# Patient Record
Sex: Male | Born: 1960 | Race: White | Hispanic: No | Marital: Single | Smoking: Never smoker
Health system: Southern US, Community
[De-identification: ages and names within clinical notes are randomized; demographics above are authoritative.]

## PROBLEM LIST (undated history)

## (undated) DIAGNOSIS — I471 Supraventricular tachycardia, unspecified: Secondary | ICD-10-CM

## (undated) DIAGNOSIS — R000 Tachycardia, unspecified: Secondary | ICD-10-CM

## (undated) HISTORY — PX: VASECTOMY: SHX75

## (undated) HISTORY — DX: Tachycardia, unspecified: R00.0

---

## 2006-12-15 ENCOUNTER — Ambulatory Visit: Payer: Self-pay | Admitting: Cardiology

## 2007-01-01 ENCOUNTER — Ambulatory Visit: Payer: Self-pay | Admitting: Cardiology

## 2007-01-01 ENCOUNTER — Encounter: Payer: Self-pay | Admitting: Cardiology

## 2007-01-01 ENCOUNTER — Ambulatory Visit: Payer: Self-pay

## 2007-01-27 ENCOUNTER — Ambulatory Visit: Payer: Self-pay | Admitting: Cardiology

## 2008-11-11 ENCOUNTER — Emergency Department (HOSPITAL_COMMUNITY): Admission: EM | Admit: 2008-11-11 | Discharge: 2008-11-12 | Payer: Self-pay | Admitting: Emergency Medicine

## 2010-12-20 LAB — COMPREHENSIVE METABOLIC PANEL
AST: 26 U/L (ref 0–37)
CO2: 24 mEq/L (ref 19–32)
Calcium: 9.4 mg/dL (ref 8.4–10.5)
Creatinine, Ser: 1.17 mg/dL (ref 0.4–1.5)
GFR calc Af Amer: >60 mL/min (ref >60)
GFR calc non Af Amer: >60 mL/min (ref >60)
Total Protein: 7.2 g/dL (ref 6.0–8.3)

## 2010-12-20 LAB — URINALYSIS, ROUTINE W REFLEX MICROSCOPIC
Hgb urine dipstick: NEGATIVE
Ketones, ur: 15 mg/dL — AB
Protein, ur: NEGATIVE mg/dL
Specific Gravity, Urine: 1.027 (ref 1.005–1.030)
Urobilinogen, UA: 0.2 mg/dL (ref 0.0–1.0)

## 2010-12-20 LAB — LIPASE, BLOOD: Lipase: 22 U/L (ref 11–59)

## 2010-12-20 LAB — DIFFERENTIAL
Eosinophils Relative: 0 % (ref 0–5)
Lymphocytes Relative: 2 % — ABNORMAL LOW (ref 12–46)
Lymphs Abs: 0.2 10*3/uL — ABNORMAL LOW (ref 0.7–4.0)
Neutrophils Relative %: 94 % — ABNORMAL HIGH (ref 43–77)

## 2010-12-20 LAB — CBC
MCHC: 34.1 g/dL (ref 30.0–36.0)
MCV: 90.4 fL (ref 78.0–100.0)
RBC: 5.03 MIL/uL (ref 4.22–5.81)
RDW: 12.5 % (ref 11.5–15.5)

## 2011-01-22 NOTE — Assessment & Plan Note (Signed)
[facility]                            CARDIOLOGY OFFICE NOTE   ANUP, BRIGHAM                           MRN:          [id number]  DATE:01/27/2007                            DOB:          Dec 08, 1960    Mr. Louis Leon is a pleasant 50 year old gentleman that I recently saw for  atypical chest pain and palpitations.  We did schedule him to have an  echocardiogram, which was performed on January 01, 2007.  His LV function  was normal.  There were no significant abnormalities noted.  He also had  a stress echocardiogram performed on January 01, 2007.  He exercised for  13 minutes.  There were no ST changes, and there was no significant wall  motion abnormality.  Note, we also checked a D-dimer, which was normal  at 0.22.  The patient also complained of palpitations and we scheduled  him to have an event monitor, but he did not wear this.  Since that  time, he has not had chest pain or palpitations, and there is no  dyspnea.  He is on no medications.   PHYSICAL EXAMINATION:  VITAL SIGNS:  His blood pressure is 102/79, and  his pulse is 70.  He weighs 156 pounds.  NECK:  Supple.  CHEST:  Clear.  CARDIOVASCULAR:  Regular rhythm.  EXTREMITIES:  No edema.   DIAGNOSES:  1. Recent atypical chest pain - his stress echocardiogram was normal.      We will not pursue further cardiac workup.  Note, his D-dimer was      also normal.  He has had no further chest pain.  2. History of palpitations - we will continue to follow him for any      recurrent arrhythmias, and he will wear the event monitor as      scheduled.  If we find significant arrhythmias, then we will treat      as indicated.  I have asked him to see [place] on a p.r.n. basis.     Louis Frieze Jens Som, MD, [facility]  Electronically Signed    BSC/MedQ  DD: 01/27/2007  DT: 01/27/2007  Job #: [id number]   cc:   [facility]

## 2011-01-25 NOTE — Assessment & Plan Note (Signed)
[place] [facility]                            CARDIOLOGY OFFICE NOTE   YGNACIO, FECTEAU                           MRN:          [id number]  DATE:12/15/2006                            DOB:          [birth year]    The patient is a very pleasant 50 year old gentleman with no prior  cardiac history who presents for evaluation of chest pain and  palpitations.  The patient states he has had an intermittent sharp pain  in his chest for years.  However, over the past 2 months he has had  episodes of sharp pain in the left chest area that have lasted for  hours, up to a day.  The pain increases with inspiration but is not  positional nor is it exertional.  It is not related to food.  There is  no associated nausea, vomiting, shortness of breath or diaphoresis.  His  last episode was approximately 2 weeks ago.  Because of his symptoms, he  wanted to be evaluated.  Also note he has had intermittent palpitations  for years.  He describes his heart as racing with sudden onset.  There  is associated weakness but there is no chest pain, shortness of breath  or syncope.  It typically resolves in 30 seconds to 1 minute.   MEDICATIONS:  None.  He has no known drug allergies.   SOCIAL HISTORY:  He does not smoke and only occasionally consumes  alcohol.  There is no cocaine use.   FAMILY HISTORY:  Negative for coronary artery disease or sudden death.   PAST MEDICAL HISTORY:  There is no diabetes mellitus, hypertension, or  hyperlipidemia.  He has had a prior hernia repair as a child and his  wisdom teeth removed.   REVIEW OF SYSTEMS:  He denies any headaches or fevers or chills.  There  is no productive cough or hemoptysis.  There is no dysphagia,  odynophagia, melena, or hematochezia.  There is no dysuria or hematuria.  There is no rash or seizure activity.  There is no orthopnea, PND or  pedal edema.  There is no claudication noted.  The remaining systems are   negative.   PHYSICAL EXAMINATION TODAY:  VITAL SIGNS:  Shows a blood pressure of  120/64 and his pulse is 66.  He weighs 157 pounds.  GENERAL:  He is well-developed and well-nourished, no acute distress.  His skin is warm and dry.  He does not appear to be depressed and there  is no peripheral clubbing.  His back is normal.  HEENT:  Unremarkable with normal eyelids.  NECK:  Supple with a normal upstroke bilaterally and there are no bruits  noted.  There is no jugular venous distention and no thyromegaly noted.  CHEST:  Clear to auscultation with normal expansion.  CARDIOVASCULAR:  Reveals a regular rate and rhythm, normal S1 and S2.  There are no murmurs, rubs or gallops noted.  His PMI is nondisplaced.  ABDOMEN:  Not tender or distended.  Positive bowel sounds, no  hepatosplenomegaly, no mass appreciated.  There is no abdominal bruit.  He has 2+ femoral pulses bilaterally and no bruits.  EXTREMITIES:  Show no edema and I can palpate no cords.  He has 2+  dorsalis pedis pulses bilaterally.  NEUROLOGIC:  Grossly intact.   Electrocardiogram shows a sinus rhythm at a rate of 66.  There is LVH.  There is diffuse ST elevation consistent with most likely repolarization  abnormality.   DIAGNOSIS:  1. Atypical chest pain.  2. History of palpitations.   PLAN:  Mr. Thieme presents for evaluation of chest pain of uncertain  etiology.  His description is atypical and pleuritic.  We will check a D-  dimer.  I will also schedule him to have an echocardiogram to quantify  his left ventricular function and to exclude effusion, although I think  his ST elevation is related to early repolarization abnormality.  We  will also schedule him to have a stress echocardiogram to exclude  coronary disease, although I think this is unlikely.  He also is  describing what sounds to be potentially SVT.  We will schedule an event  monitor for further evaluation.  I will see him back in 4-6 weeks to  review the  above.     Madolyn Frieze Jens Som, MD, [facility]  Electronically Signed    BSC/MedQ  DD: 12/15/2006  DT: 12/15/2006  Job #: [id number]   cc:   [facility]

## 2011-04-14 ENCOUNTER — Inpatient Hospital Stay (HOSPITAL_COMMUNITY)
Admission: EM | Admit: 2011-04-14 | Discharge: 2011-04-15 | DRG: 139 | Disposition: A | Discharge status: Home / Self Care | Payer: BC Managed Care – PPO | Attending: Cardiovascular Disease | Admitting: Cardiovascular Disease

## 2011-04-14 ENCOUNTER — Emergency Department (HOSPITAL_COMMUNITY): Payer: BC Managed Care – PPO

## 2011-04-14 DIAGNOSIS — I498 Other specified cardiac arrhythmias: Principal | ICD-10-CM | POA: Diagnosis present

## 2011-04-14 DIAGNOSIS — R002 Palpitations: Secondary | ICD-10-CM | POA: Diagnosis present

## 2011-04-14 LAB — TROPONIN I: Troponin I: <0.3 ng/mL (ref <0.30)

## 2011-04-14 LAB — POCT I-STAT, CHEM 8
BUN: 13 mg/dL (ref 6–23)
Chloride: 107 mEq/L (ref 96–112)
Sodium: 141 mEq/L (ref 135–145)

## 2011-04-14 LAB — CBC
Hemoglobin: 15.3 g/dL (ref 13.0–17.0)
MCH: 30.4 pg (ref 26.0–34.0)
MCV: 88.7 fL (ref 78.0–100.0)
RBC: 5.04 MIL/uL (ref 4.22–5.81)

## 2011-04-14 LAB — DIFFERENTIAL
Lymphs Abs: 2.6 10*3/uL (ref 0.7–4.0)
Monocytes Relative: 14 % — ABNORMAL HIGH (ref 3–12)
Neutro Abs: 9 10*3/uL — ABNORMAL HIGH (ref 1.7–7.7)
Neutrophils Relative %: 66 % (ref 43–77)

## 2011-04-15 LAB — LIPID PANEL
Cholesterol: 191 mg/dL (ref 0–200)
HDL: 35 mg/dL — ABNORMAL LOW (ref >39)
Total CHOL/HDL Ratio: 5.5 RATIO

## 2013-08-03 ENCOUNTER — Ambulatory Visit (INDEPENDENT_AMBULATORY_CARE_PROVIDER_SITE_OTHER): Payer: BC Managed Care – PPO | Admitting: Family Medicine

## 2013-08-03 ENCOUNTER — Encounter: Payer: Self-pay | Admitting: Family Medicine

## 2013-08-03 VITALS — BP 97/57 | HR 75 | Temp 97.8°F | Wt 163.8 lb

## 2013-08-03 DIAGNOSIS — J399 Disease of upper respiratory tract, unspecified: Secondary | ICD-10-CM

## 2013-08-03 DIAGNOSIS — J398 Other specified diseases of upper respiratory tract: Secondary | ICD-10-CM

## 2013-08-03 MED ORDER — AMOXICILLIN-POT CLAVULANATE 875-125 MG PO TABS: 1.0000 | ORAL_TABLET | Freq: Two times a day (BID) | ORAL | Status: DC

## 2013-08-03 MED ORDER — BENZONATATE 100 MG PO CAPS: 100.0000 mg | ORAL_CAPSULE | Freq: Two times a day (BID) | ORAL | Status: DC | PRN

## 2013-08-03 NOTE — Progress Notes (Signed)
  Subjective:    Patient ID: Louis Leon, male    DOB: 1960/11/19, 52 y.o.   MRN: [id number]  Sinusitis This is a new problem. The current episode started 1 to 4 weeks ago (About 12 days ago). The problem has been gradually worsening since onset. There has been no fever. His pain is at a severity of 2/10. The pain is mild. Associated symptoms include congestion, coughing, sinus pressure, sneezing, a sore throat and swollen glands. Pertinent negatives include no ear pain, headaches, neck pain or shortness of breath. Past treatments include acetaminophen. The treatment provided mild relief.      Review of Systems  HENT: Positive for congestion, sinus pressure, sneezing and sore throat. Negative for ear pain.   Respiratory: Positive for cough. Negative for shortness of breath.   Musculoskeletal: Negative for neck pain.  Neurological: Negative for headaches.  All other systems reviewed and are negative.       Objective:   Physical Exam  Vitals reviewed. Constitutional: He is oriented to person, place, and time. He appears well-developed and well-nourished.  HENT:  Head: Normocephalic.  Right Ear: External ear normal.  Left Ear: External ear normal.  Nose: Nose normal. Right sinus exhibits no maxillary sinus tenderness and no frontal sinus tenderness. Left sinus exhibits no maxillary sinus tenderness and no frontal sinus tenderness.  Mouth/Throat: Oropharynx is clear and moist.  Eyes: Pupils are equal, round, and reactive to light.  Neck: Normal range of motion. Neck supple. No thyromegaly present.  Cardiovascular: Normal rate, regular rhythm, normal heart sounds and intact distal pulses.   No murmur heard. Pulmonary/Chest: Effort normal and breath sounds normal. No respiratory distress. He has no wheezes.  Abdominal: Soft. Bowel sounds are normal. He exhibits no distension. There is no tenderness.  Neurological: He is alert and oriented to person, place, and time.  Skin: Skin is warm and  dry. No rash noted. No erythema.  Psychiatric: He has a normal mood and affect. His behavior is normal. Judgment and thought content normal.     BP 97/57  Pulse 75  Temp(Src) 97.8 F (36.6 C) (Oral)  Wt 163 lb 12.8 oz (74.299 kg)      Assessment & Plan:  Upper respiratory disease - Plan: amoxicillin-clavulanate (AUGMENTIN) 875-125 MG per tablet, benzonatate (TESSALON) 100 MG capsule  Deatra Canter FNP

## 2013-08-03 NOTE — Patient Instructions (Addendum)

## 2014-02-07 ENCOUNTER — Other Ambulatory Visit: Payer: Self-pay

## 2014-02-07 MED ORDER — DILTIAZEM HCL 30 MG PO TABS: 30.0000 mg | ORAL_TABLET | Freq: Two times a day (BID) | ORAL | Status: DC

## 2014-02-07 MED ORDER — METOPROLOL TARTRATE 25 MG PO TABS: 25.0000 mg | ORAL_TABLET | Freq: Two times a day (BID) | ORAL | Status: DC

## 2014-02-07 NOTE — Telephone Encounter (Signed)
Last seen 08/03/13  B Oxford 

## 2014-02-07 NOTE — Telephone Encounter (Signed)
Last seen 08/03/13 B [place]

## 2014-09-07 ENCOUNTER — Ambulatory Visit (INDEPENDENT_AMBULATORY_CARE_PROVIDER_SITE_OTHER): Payer: BC Managed Care – PPO | Admitting: Family Medicine

## 2014-09-07 ENCOUNTER — Encounter: Payer: Self-pay | Admitting: Family Medicine

## 2014-09-07 VITALS — BP 102/66 | HR 61 | Temp 97.7°F | Ht 72.0 in | Wt 160.0 lb

## 2014-09-07 DIAGNOSIS — H6123 Impacted cerumen, bilateral: Secondary | ICD-10-CM

## 2014-09-07 NOTE — Progress Notes (Signed)
   Subjective:    Patient ID: Louis Leon, male    DOB: 05/13/1961, 53 y.o.   MRN: [id number]  HPI  C/o cerumen impactions bilateral. Review of Systems  Constitutional: Negative for fever.  HENT: Negative for ear pain.   Eyes: Negative for discharge.  Respiratory: Negative for cough.   Cardiovascular: Negative for chest pain.  Gastrointestinal: Negative for abdominal distention.  Endocrine: Negative for polyuria.  Genitourinary: Negative for difficulty urinating.  Musculoskeletal: Negative for gait problem and neck pain.  Skin: Negative for color change and rash.  Neurological: Negative for speech difficulty and headaches.  Psychiatric/Behavioral: Negative for agitation.       Objective:    BP 102/66 mmHg  Pulse 61  Temp(Src) 97.7 F (36.5 C) (Oral)  Ht 6' (1.829 m)  Wt 160 lb (72.576 kg)  BMI 21.70 kg/m2 Physical Exam  Constitutional: He is oriented to person, place, and time. He appears well-developed and well-nourished.  HENT:  Head: Normocephalic and atraumatic.  Mouth/Throat: Oropharynx is clear and moist.  Bilateral EAC's with cerumen impaction  Eyes: Pupils are equal, round, and reactive to light.  Neck: Normal range of motion. Neck supple.  Cardiovascular: Normal rate and regular rhythm.   No murmur heard. Pulmonary/Chest: Effort normal and breath sounds normal.  Abdominal: Soft. Bowel sounds are normal. There is no tenderness.  Neurological: He is alert and oriented to person, place, and time.  Skin: Skin is warm and dry.  Psychiatric: He has a normal mood and affect.          Assessment & Plan:     ICD-9-CM ICD-10-CM   1. Cerumen impaction, bilateral 380.4 H61.23    Irrigate bilateral ears   Return if symptoms worsen or fail to improve.  Deatra CanterWilliam J Oxford FNP

## 2019-03-01 ENCOUNTER — Encounter: Payer: Self-pay | Admitting: *Deleted

## 2020-10-04 ENCOUNTER — Ambulatory Visit
Admission: RE | Admit: 2020-10-04 | Discharge: 2020-10-04 | Disposition: A | Discharge status: Home / Self Care | Payer: BC Managed Care – PPO | Source: Ambulatory Visit | Attending: Cardiovascular Disease | Admitting: Cardiovascular Disease

## 2020-10-04 ENCOUNTER — Other Ambulatory Visit: Payer: Self-pay | Admitting: Cardiovascular Disease

## 2020-10-04 DIAGNOSIS — R0602 Shortness of breath: Secondary | ICD-10-CM

## 2021-05-17 DIAGNOSIS — E7849 Other hyperlipidemia: Secondary | ICD-10-CM | POA: Diagnosis not present

## 2021-05-17 DIAGNOSIS — R072 Precordial pain: Secondary | ICD-10-CM | POA: Diagnosis not present

## 2021-05-17 DIAGNOSIS — R0602 Shortness of breath: Secondary | ICD-10-CM | POA: Diagnosis not present

## 2021-05-17 DIAGNOSIS — I471 Supraventricular tachycardia: Secondary | ICD-10-CM | POA: Diagnosis not present

## 2021-08-22 DIAGNOSIS — L821 Other seborrheic keratosis: Secondary | ICD-10-CM | POA: Diagnosis not present

## 2021-08-22 DIAGNOSIS — L218 Other seborrheic dermatitis: Secondary | ICD-10-CM | POA: Diagnosis not present

## 2021-10-24 DIAGNOSIS — E876 Hypokalemia: Secondary | ICD-10-CM | POA: Diagnosis not present

## 2021-10-24 DIAGNOSIS — E7849 Other hyperlipidemia: Secondary | ICD-10-CM | POA: Diagnosis not present

## 2021-10-24 DIAGNOSIS — Z79899 Other long term (current) drug therapy: Secondary | ICD-10-CM | POA: Diagnosis not present

## 2022-02-05 DIAGNOSIS — I959 Hypotension, unspecified: Secondary | ICD-10-CM | POA: Diagnosis not present

## 2022-02-05 DIAGNOSIS — R519 Headache, unspecified: Secondary | ICD-10-CM | POA: Diagnosis not present

## 2022-02-05 DIAGNOSIS — R7401 Elevation of levels of liver transaminase levels: Secondary | ICD-10-CM | POA: Diagnosis not present

## 2022-02-05 DIAGNOSIS — B349 Viral infection, unspecified: Secondary | ICD-10-CM | POA: Diagnosis not present

## 2022-02-05 DIAGNOSIS — M542 Cervicalgia: Secondary | ICD-10-CM | POA: Diagnosis not present

## 2022-02-05 DIAGNOSIS — I471 Supraventricular tachycardia: Secondary | ICD-10-CM | POA: Diagnosis not present

## 2022-02-05 DIAGNOSIS — R1011 Right upper quadrant pain: Secondary | ICD-10-CM | POA: Diagnosis not present

## 2022-02-05 DIAGNOSIS — R0689 Other abnormalities of breathing: Secondary | ICD-10-CM | POA: Diagnosis not present

## 2022-02-05 DIAGNOSIS — R10816 Epigastric abdominal tenderness: Secondary | ICD-10-CM | POA: Diagnosis not present

## 2022-02-05 DIAGNOSIS — G44209 Tension-type headache, unspecified, not intractable: Secondary | ICD-10-CM | POA: Diagnosis not present

## 2022-02-05 DIAGNOSIS — Z20822 Contact with and (suspected) exposure to covid-19: Secondary | ICD-10-CM | POA: Diagnosis not present

## 2022-02-05 DIAGNOSIS — R7989 Other specified abnormal findings of blood chemistry: Secondary | ICD-10-CM | POA: Diagnosis not present

## 2022-02-05 DIAGNOSIS — G4489 Other headache syndrome: Secondary | ICD-10-CM | POA: Diagnosis not present

## 2022-02-05 DIAGNOSIS — R0602 Shortness of breath: Secondary | ICD-10-CM | POA: Diagnosis not present

## 2022-02-05 DIAGNOSIS — K824 Cholesterolosis of gallbladder: Secondary | ICD-10-CM | POA: Diagnosis not present

## 2022-02-05 DIAGNOSIS — I4891 Unspecified atrial fibrillation: Secondary | ICD-10-CM | POA: Diagnosis not present

## 2022-02-05 DIAGNOSIS — M436 Torticollis: Secondary | ICD-10-CM | POA: Diagnosis not present

## 2022-02-06 ENCOUNTER — Other Ambulatory Visit: Payer: Self-pay

## 2022-02-06 ENCOUNTER — Encounter (HOSPITAL_BASED_OUTPATIENT_CLINIC_OR_DEPARTMENT_OTHER): Payer: Self-pay | Admitting: Emergency Medicine

## 2022-02-06 ENCOUNTER — Inpatient Hospital Stay (HOSPITAL_BASED_OUTPATIENT_CLINIC_OR_DEPARTMENT_OTHER)
Admission: EM | Admit: 2022-02-06 | Discharge: 2022-02-11 | DRG: 684 | Disposition: A | Discharge status: Home / Self Care | Payer: BC Managed Care – PPO | Attending: Internal Medicine | Admitting: Internal Medicine

## 2022-02-06 DIAGNOSIS — R109 Unspecified abdominal pain: Principal | ICD-10-CM

## 2022-02-06 DIAGNOSIS — K5730 Diverticulosis of large intestine without perforation or abscess without bleeding: Secondary | ICD-10-CM | POA: Diagnosis not present

## 2022-02-06 DIAGNOSIS — I4891 Unspecified atrial fibrillation: Secondary | ICD-10-CM | POA: Diagnosis not present

## 2022-02-06 DIAGNOSIS — K208 Other esophagitis without bleeding: Secondary | ICD-10-CM | POA: Diagnosis not present

## 2022-02-06 DIAGNOSIS — K269 Duodenal ulcer, unspecified as acute or chronic, without hemorrhage or perforation: Secondary | ICD-10-CM | POA: Diagnosis not present

## 2022-02-06 DIAGNOSIS — K2289 Other specified disease of esophagus: Secondary | ICD-10-CM | POA: Diagnosis not present

## 2022-02-06 DIAGNOSIS — R1011 Right upper quadrant pain: Secondary | ICD-10-CM | POA: Diagnosis not present

## 2022-02-06 DIAGNOSIS — R7989 Other specified abnormal findings of blood chemistry: Secondary | ICD-10-CM | POA: Diagnosis present

## 2022-02-06 DIAGNOSIS — N281 Cyst of kidney, acquired: Secondary | ICD-10-CM | POA: Diagnosis not present

## 2022-02-06 DIAGNOSIS — N289 Disorder of kidney and ureter, unspecified: Secondary | ICD-10-CM | POA: Diagnosis not present

## 2022-02-06 DIAGNOSIS — R188 Other ascites: Secondary | ICD-10-CM | POA: Diagnosis not present

## 2022-02-06 DIAGNOSIS — K31A0 Gastric intestinal metaplasia, unspecified: Secondary | ICD-10-CM | POA: Diagnosis not present

## 2022-02-06 DIAGNOSIS — N179 Acute kidney failure, unspecified: Secondary | ICD-10-CM | POA: Diagnosis not present

## 2022-02-06 DIAGNOSIS — I10 Essential (primary) hypertension: Secondary | ICD-10-CM | POA: Diagnosis not present

## 2022-02-06 DIAGNOSIS — R935 Abnormal findings on diagnostic imaging of other abdominal regions, including retroperitoneum: Secondary | ICD-10-CM | POA: Diagnosis not present

## 2022-02-06 DIAGNOSIS — R9431 Abnormal electrocardiogram [ECG] [EKG]: Secondary | ICD-10-CM | POA: Diagnosis not present

## 2022-02-06 DIAGNOSIS — Z825 Family history of asthma and other chronic lower respiratory diseases: Secondary | ICD-10-CM

## 2022-02-06 DIAGNOSIS — R1010 Upper abdominal pain, unspecified: Secondary | ICD-10-CM | POA: Diagnosis not present

## 2022-02-06 DIAGNOSIS — R945 Abnormal results of liver function studies: Secondary | ICD-10-CM | POA: Diagnosis not present

## 2022-02-06 DIAGNOSIS — R1013 Epigastric pain: Secondary | ICD-10-CM | POA: Diagnosis not present

## 2022-02-06 DIAGNOSIS — K828 Other specified diseases of gallbladder: Secondary | ICD-10-CM | POA: Diagnosis not present

## 2022-02-06 DIAGNOSIS — Z823 Family history of stroke: Secondary | ICD-10-CM | POA: Diagnosis not present

## 2022-02-06 DIAGNOSIS — R112 Nausea with vomiting, unspecified: Secondary | ICD-10-CM | POA: Diagnosis not present

## 2022-02-06 HISTORY — DX: Supraventricular tachycardia, unspecified: I47.10

## 2022-02-06 HISTORY — DX: Supraventricular tachycardia: I47.1

## 2022-02-06 LAB — CBC
HCT: 41.9 % (ref 39.0–52.0)
Hemoglobin: 13.9 g/dL (ref 13.0–17.0)
MCH: 30 pg (ref 26.0–34.0)
MCHC: 33.2 g/dL (ref 30.0–36.0)
MCV: 90.5 fL (ref 80.0–100.0)
Platelets: 129 10*3/uL — ABNORMAL LOW (ref 150–400)
RBC: 4.63 MIL/uL (ref 4.22–5.81)
RDW: 12.7 % (ref 11.5–15.5)
WBC: 1.9 10*3/uL — ABNORMAL LOW (ref 4.0–10.5)
nRBC: 0 % (ref 0.0–0.2)

## 2022-02-06 LAB — COMPREHENSIVE METABOLIC PANEL
ALT: 171 U/L — ABNORMAL HIGH (ref 0–44)
AST: 184 U/L — ABNORMAL HIGH (ref 15–41)
Albumin: 3.6 g/dL (ref 3.5–5.0)
Alkaline Phosphatase: 240 U/L — ABNORMAL HIGH (ref 38–126)
Anion gap: 10 (ref 5–15)
BUN: 19 mg/dL (ref 8–23)
CO2: 21 mmol/L — ABNORMAL LOW (ref 22–32)
Calcium: 8.8 mg/dL — ABNORMAL LOW (ref 8.9–10.3)
Chloride: 102 mmol/L (ref 98–111)
Creatinine, Ser: 1.33 mg/dL — ABNORMAL HIGH (ref 0.61–1.24)
GFR, Estimated: >60 mL/min (ref >60)
Glucose, Bld: 131 mg/dL — ABNORMAL HIGH (ref 70–99)
Potassium: 4.6 mmol/L (ref 3.5–5.1)
Sodium: 133 mmol/L — ABNORMAL LOW (ref 135–145)
Total Bilirubin: 2.6 mg/dL — ABNORMAL HIGH (ref 0.3–1.2)
Total Protein: 6.4 g/dL — ABNORMAL LOW (ref 6.5–8.1)

## 2022-02-06 LAB — LIPASE, BLOOD: Lipase: 32 U/L (ref 11–51)

## 2022-02-06 NOTE — ED Triage Notes (Signed)
Nausea x 5 days, general body aches. Neck pain. Seen at [facility] yesterday evaluated and told to f/u for gallstones. Felt ok yesterday when d/c'd but started having worsening symptoms in AM.

## 2022-02-07 ENCOUNTER — Observation Stay (HOSPITAL_COMMUNITY): Payer: BC Managed Care – PPO

## 2022-02-07 ENCOUNTER — Emergency Department (HOSPITAL_BASED_OUTPATIENT_CLINIC_OR_DEPARTMENT_OTHER): Payer: BC Managed Care – PPO

## 2022-02-07 DIAGNOSIS — R109 Unspecified abdominal pain: Secondary | ICD-10-CM

## 2022-02-07 DIAGNOSIS — R7989 Other specified abnormal findings of blood chemistry: Secondary | ICD-10-CM | POA: Diagnosis present

## 2022-02-07 DIAGNOSIS — K5730 Diverticulosis of large intestine without perforation or abscess without bleeding: Secondary | ICD-10-CM | POA: Diagnosis not present

## 2022-02-07 DIAGNOSIS — K828 Other specified diseases of gallbladder: Secondary | ICD-10-CM | POA: Diagnosis not present

## 2022-02-07 DIAGNOSIS — N179 Acute kidney failure, unspecified: Secondary | ICD-10-CM | POA: Diagnosis not present

## 2022-02-07 DIAGNOSIS — I4891 Unspecified atrial fibrillation: Secondary | ICD-10-CM | POA: Diagnosis not present

## 2022-02-07 DIAGNOSIS — R188 Other ascites: Secondary | ICD-10-CM | POA: Diagnosis not present

## 2022-02-07 DIAGNOSIS — Z825 Family history of asthma and other chronic lower respiratory diseases: Secondary | ICD-10-CM | POA: Diagnosis not present

## 2022-02-07 DIAGNOSIS — Z823 Family history of stroke: Secondary | ICD-10-CM | POA: Diagnosis not present

## 2022-02-07 DIAGNOSIS — K269 Duodenal ulcer, unspecified as acute or chronic, without hemorrhage or perforation: Secondary | ICD-10-CM | POA: Diagnosis not present

## 2022-02-07 DIAGNOSIS — R1013 Epigastric pain: Secondary | ICD-10-CM | POA: Diagnosis present

## 2022-02-07 DIAGNOSIS — R935 Abnormal findings on diagnostic imaging of other abdominal regions, including retroperitoneum: Secondary | ICD-10-CM | POA: Diagnosis not present

## 2022-02-07 DIAGNOSIS — R945 Abnormal results of liver function studies: Secondary | ICD-10-CM | POA: Diagnosis not present

## 2022-02-07 DIAGNOSIS — N281 Cyst of kidney, acquired: Secondary | ICD-10-CM | POA: Diagnosis not present

## 2022-02-07 LAB — URINALYSIS, ROUTINE W REFLEX MICROSCOPIC
Glucose, UA: NEGATIVE mg/dL
Hgb urine dipstick: NEGATIVE
Ketones, ur: NEGATIVE mg/dL
Leukocytes,Ua: NEGATIVE
Nitrite: NEGATIVE
Protein, ur: 30 mg/dL — AB
Specific Gravity, Urine: 1.027 (ref 1.005–1.030)
pH: 6 (ref 5.0–8.0)

## 2022-02-07 LAB — HEPATIC FUNCTION PANEL
ALT: 212 U/L — ABNORMAL HIGH (ref 0–44)
AST: 249 U/L — ABNORMAL HIGH (ref 15–41)
Albumin: 3 g/dL — ABNORMAL LOW (ref 3.5–5.0)
Alkaline Phosphatase: 237 U/L — ABNORMAL HIGH (ref 38–126)
Bilirubin, Direct: 2 mg/dL — ABNORMAL HIGH (ref 0.0–0.2)
Indirect Bilirubin: 1.1 mg/dL — ABNORMAL HIGH (ref 0.3–0.9)
Total Bilirubin: 3.1 mg/dL — ABNORMAL HIGH (ref 0.3–1.2)
Total Protein: 5.9 g/dL — ABNORMAL LOW (ref 6.5–8.1)

## 2022-02-07 LAB — CBC WITH DIFFERENTIAL/PLATELET
Abs Immature Granulocytes: 0.01 10*3/uL (ref 0.00–0.07)
Basophils Absolute: 0 10*3/uL (ref 0.0–0.1)
Basophils Relative: 1 %
Eosinophils Absolute: 0 10*3/uL (ref 0.0–0.5)
Eosinophils Relative: 0 %
HCT: 41.2 % (ref 39.0–52.0)
Hemoglobin: 13.7 g/dL (ref 13.0–17.0)
Immature Granulocytes: 1 %
Lymphocytes Relative: 44 %
Lymphs Abs: 0.9 10*3/uL (ref 0.7–4.0)
MCH: 30.7 pg (ref 26.0–34.0)
MCHC: 33.3 g/dL (ref 30.0–36.0)
MCV: 92.4 fL (ref 80.0–100.0)
Monocytes Absolute: 0.2 10*3/uL (ref 0.1–1.0)
Monocytes Relative: 9 %
Neutro Abs: 0.9 10*3/uL — ABNORMAL LOW (ref 1.7–7.7)
Neutrophils Relative %: 45 %
Platelets: 121 10*3/uL — ABNORMAL LOW (ref 150–400)
RBC: 4.46 MIL/uL (ref 4.22–5.81)
RDW: 12.9 % (ref 11.5–15.5)
WBC: 2 10*3/uL — ABNORMAL LOW (ref 4.0–10.5)
nRBC: 0 % (ref 0.0–0.2)

## 2022-02-07 LAB — RAPID URINE DRUG SCREEN, HOSP PERFORMED
Amphetamines: NOT DETECTED
Barbiturates: POSITIVE — AB
Benzodiazepines: NOT DETECTED
Cocaine: NOT DETECTED
Opiates: POSITIVE — AB
Tetrahydrocannabinol: NOT DETECTED

## 2022-02-07 LAB — COMPREHENSIVE METABOLIC PANEL
ALT: 205 U/L — ABNORMAL HIGH (ref 0–44)
AST: 242 U/L — ABNORMAL HIGH (ref 15–41)
Albumin: 3.3 g/dL — ABNORMAL LOW (ref 3.5–5.0)
Alkaline Phosphatase: 252 U/L — ABNORMAL HIGH (ref 38–126)
Anion gap: 8 (ref 5–15)
BUN: 19 mg/dL (ref 8–23)
CO2: 27 mmol/L (ref 22–32)
Calcium: 8.6 mg/dL — ABNORMAL LOW (ref 8.9–10.3)
Chloride: 101 mmol/L (ref 98–111)
Creatinine, Ser: 1.27 mg/dL — ABNORMAL HIGH (ref 0.61–1.24)
GFR, Estimated: >60 mL/min (ref >60)
Glucose, Bld: 104 mg/dL — ABNORMAL HIGH (ref 70–99)
Potassium: 4 mmol/L (ref 3.5–5.1)
Sodium: 136 mmol/L (ref 135–145)
Total Bilirubin: 3.1 mg/dL — ABNORMAL HIGH (ref 0.3–1.2)
Total Protein: 6.1 g/dL — ABNORMAL LOW (ref 6.5–8.1)

## 2022-02-07 LAB — CBC
HCT: 40.8 % (ref 39.0–52.0)
Hemoglobin: 13.7 g/dL (ref 13.0–17.0)
MCH: 30.9 pg (ref 26.0–34.0)
MCHC: 33.6 g/dL (ref 30.0–36.0)
MCV: 91.9 fL (ref 80.0–100.0)
Platelets: 120 10*3/uL — ABNORMAL LOW (ref 150–400)
RBC: 4.44 MIL/uL (ref 4.22–5.81)
RDW: 12.8 % (ref 11.5–15.5)
WBC: 2 10*3/uL — ABNORMAL LOW (ref 4.0–10.5)
nRBC: 0 % (ref 0.0–0.2)

## 2022-02-07 LAB — HEPATITIS PANEL, ACUTE
HCV Ab: NONREACTIVE
Hep A IgM: NONREACTIVE
Hep B C IgM: NONREACTIVE
Hepatitis B Surface Ag: NONREACTIVE

## 2022-02-07 LAB — MAGNESIUM: Magnesium: 2.1 mg/dL (ref 1.7–2.4)

## 2022-02-07 MED ORDER — ONDANSETRON HCL 4 MG/2ML IJ SOLN
4.0000 mg | Freq: Four times a day (QID) | INTRAMUSCULAR | Status: DC | PRN
  Administered 2022-02-07 – 2022-02-09 (×7): 4 mg via INTRAVENOUS
  Filled 2022-02-07 (×7): qty 2

## 2022-02-07 MED ORDER — ACETAMINOPHEN 650 MG RE SUPP: 650.0000 mg | Freq: Four times a day (QID) | RECTAL | Status: DC | PRN

## 2022-02-07 MED ORDER — GADOBUTROL 1 MMOL/ML IV SOLN
7.0000 mL | Freq: Once | INTRAVENOUS | Status: AC | PRN
  Administered 2022-02-07: 7 mL via INTRAVENOUS

## 2022-02-07 MED ORDER — DILTIAZEM HCL 60 MG PO TABS
60.0000 mg | ORAL_TABLET | Freq: Two times a day (BID) | ORAL | Status: DC
  Administered 2022-02-07 – 2022-02-11 (×8): 60 mg via ORAL
  Filled 2022-02-07 (×9): qty 1

## 2022-02-07 MED ORDER — PIPERACILLIN-TAZOBACTAM 3.375 G IVPB
3.3750 g | Freq: Three times a day (TID) | INTRAVENOUS | Status: DC
  Administered 2022-02-07 – 2022-02-11 (×12): 3.375 g via INTRAVENOUS
  Filled 2022-02-07 (×14): qty 50

## 2022-02-07 MED ORDER — ACETAMINOPHEN 325 MG PO TABS
650.0000 mg | ORAL_TABLET | Freq: Four times a day (QID) | ORAL | Status: DC | PRN
  Administered 2022-02-10: 650 mg via ORAL
  Filled 2022-02-07: qty 2

## 2022-02-07 MED ORDER — NALOXONE HCL 0.4 MG/ML IJ SOLN: 0.4000 mg | INTRAMUSCULAR | Status: DC | PRN

## 2022-02-07 MED ORDER — IOHEXOL 300 MG/ML  SOLN
100.0000 mL | Freq: Once | INTRAMUSCULAR | Status: AC | PRN
  Administered 2022-02-07: 100 mL via INTRAVENOUS

## 2022-02-07 MED ORDER — ONDANSETRON HCL 4 MG/2ML IJ SOLN
4.0000 mg | Freq: Once | INTRAMUSCULAR | Status: AC
  Administered 2022-02-07: 4 mg via INTRAVENOUS
  Filled 2022-02-07: qty 2

## 2022-02-07 MED ORDER — HEPARIN SODIUM (PORCINE) 5000 UNIT/ML IJ SOLN
5000.0000 [IU] | Freq: Three times a day (TID) | INTRAMUSCULAR | Status: DC
  Administered 2022-02-07 – 2022-02-10 (×8): 5000 [IU] via SUBCUTANEOUS
  Filled 2022-02-07 (×9): qty 1

## 2022-02-07 MED ORDER — HYDROMORPHONE HCL 1 MG/ML IJ SOLN
0.5000 mg | INTRAMUSCULAR | Status: DC | PRN
  Administered 2022-02-07 – 2022-02-09 (×8): 0.5 mg via INTRAVENOUS
  Filled 2022-02-07 (×8): qty 0.5

## 2022-02-07 MED ORDER — PROCHLORPERAZINE EDISYLATE 10 MG/2ML IJ SOLN
5.0000 mg | Freq: Once | INTRAMUSCULAR | Status: AC
Start: 2022-02-07 — End: 2022-02-07
  Administered 2022-02-07: 5 mg via INTRAVENOUS
  Filled 2022-02-07: qty 2

## 2022-02-07 MED ORDER — MORPHINE SULFATE (PF) 4 MG/ML IV SOLN
4.0000 mg | Freq: Once | INTRAVENOUS | Status: AC
  Administered 2022-02-07: 4 mg via INTRAVENOUS
  Filled 2022-02-07: qty 1

## 2022-02-07 MED ORDER — SODIUM CHLORIDE 0.9 % IV SOLN: INTRAVENOUS | Status: DC

## 2022-02-07 NOTE — ED Notes (Signed)
Called [facility] to transport patient to [facility] rm# [id number]

## 2022-02-07 NOTE — Progress Notes (Signed)
@   2240 Pt c/o heart beating fast states has hx SVT. EKG results showed Afib RVR. Notified on call K.Foust,NP. Able to give Cardizem  60 mg PO with sips of water per order. Pt now resting, states his heart feels better.  02/07/22 2255  Vitals  Temp 99.8 F (37.7 C)  Temp Source Oral  BP (!) 147/90  MAP (mmHg) 107  BP Location Left Arm  BP Method Automatic  Patient Position (if appropriate) Lying  Pulse Rate (!) 109  Resp 18  MEWS COLOR  MEWS Score Color Green  Oxygen Therapy  SpO2 95 %  O2 Device Room Air  MEWS Score  MEWS Temp 0  MEWS Systolic 0  MEWS Pulse 1  MEWS RR 0  MEWS LOC 0  MEWS Score 1

## 2022-02-07 NOTE — Progress Notes (Signed)
  Transition of Care [facility]) Screening Note   Patient Details  Name: Louis Leon Date of Birth: 03-14-1961   Transition of Care [facility]) CM/SW Contact:    Amada Jupiter, LCSW Phone Number: 02/07/2022, 11:37 AM    Transition of Care Department [facility]) has reviewed patient and no TOC needs have been identified at this time. We will continue to monitor patient advancement through interdisciplinary progression rounds. If new patient transition needs arise, please place a TOC consult.

## 2022-02-07 NOTE — Progress Notes (Signed)
Pharmacy Antibiotic Note  Louis Leon is a 62 y.o. male admitted on 02/06/2022 with abdominal pain associated with N/V and acute transaminitis. Pharmacy has been consulted for Zosyn dosing for intra-abdominal infection.  Plan: Zosyn 3.375 g IV q8h extended infusion  Pharmacy to sign off consult but will continue to follow renal function, cultures and clinical progress for dosage adjustments or de-escalation as indicated.  Height: 6' (182.9 cm) Weight: 69.6 kg (153 lb 7 oz) IBW/kg (Calculated) : 77.6  Temp (24hrs), Avg:99.3 F (37.4 C), Min:98.9 F (37.2 C), Max:99.6 F (37.6 C)  Recent Labs  Lab 02/06/22 2229 02/07/22 0640  WBC 1.9* 2.0*  CREATININE 1.33* 1.27*    Estimated Creatinine Clearance: 59.4 mL/min (A) (by C-G formula based on SCr of 1.27 mg/dL (H)).    No Known Allergies  Antimicrobials this admission: Zosyn 6/1 >>   Microbiology results: N/A  Thank you for allowing pharmacy to be a part of this patient's care.  Pricilla Riffle, PharmD, BCPS Clinical Pharmacist 02/07/2022 8:23 AM

## 2022-02-07 NOTE — Progress Notes (Signed)
  Carryover admission to the Day Admitter. Transfer from [facility]. Please see Dr. Francesco Runner transfer documentation under [facility] Communication for additional details.   Briefly, This is a 61 year old male who is being admitted with acute transaminitis/presenting with abdominal pain associate with nausea/vomiting.   Order placed at the time of transfer for observation to MedSurg.   Upon the patient's arrival at [facility], I have placed some additional preliminary admit orders via the adult multi-morbid admission order set. I have also ordered prn IV Dilaudid as well as prn IV Zofran.  Have also placed orders for labs, including repeat CMP.    Newton Pigg, DO Hospitalist

## 2022-02-07 NOTE — ED Notes (Signed)
Lab notified of add on hepatitis panel

## 2022-02-07 NOTE — ED Provider Notes (Signed)
[facility] EMERGENCY DEPT Provider Note   CSN: [id number] Arrival date & time: 02/06/22  2154     History  Chief Complaint  Patient presents with   Nausea    Louis Leon is a 62 y.o. male.  Patient is a 61 year old male with past medical history of SVT.  Patient presenting today with complaints of abdominal pain and nausea.  He was seen yesterday at [facility] and underwent extensive testing.  He had mild elevations of his LFTs and ultrasound that was suggestive of possible acute cholecystitis.  He was discharged with outpatient follow-up with general surgery.  Patient also underwent lumbar puncture and CT scan of his head because he was complaining of headache and neck pain.  The studies were unremarkable.  Patient presents here with worsening nausea and upper abdominal discomfort.  He denies fevers or chills.  He denies any diarrhea or constipation.  Patient denies any prior abdominal surgery.  The history is provided by the patient.      Home Medications Prior to Admission medications   Medication Sig Start Date End Date Taking? Authorizing Provider  diltiazem (CARDIZEM) 30 MG tablet Take 1 tablet (30 mg total) by mouth 2 (two) times daily. 02/07/14   Lysbeth Penner, FNP  metoprolol tartrate (LOPRESSOR) 25 MG tablet Take 1 tablet (25 mg total) by mouth 2 (two) times daily. 02/07/14   Lysbeth Penner, FNP      Allergies    Patient has no known allergies.    Review of Systems   Review of Systems  All other systems reviewed and are negative.  Physical Exam Updated Vital Signs BP (!) 113/54   Pulse 68   Temp 98.9 F (37.2 C) (Oral)   Resp 16   Ht 6' (1.829 m)   Wt 69.6 kg   SpO2 95%   BMI 20.81 kg/m  Physical Exam Vitals and nursing note reviewed.  Constitutional:      General: He is not in acute distress.    Appearance: He is well-developed. He is not diaphoretic.  HENT:     Head: Normocephalic and atraumatic.  Cardiovascular:     Rate and  Rhythm: Normal rate and regular rhythm.     Heart sounds: No murmur heard.   No friction rub.  Pulmonary:     Effort: Pulmonary effort is normal. No respiratory distress.     Breath sounds: Normal breath sounds. No wheezing or rales.  Abdominal:     General: Bowel sounds are normal. There is no distension.     Palpations: Abdomen is soft.     Tenderness: There is abdominal tenderness. There is no right CVA tenderness, left CVA tenderness, guarding or rebound.  Musculoskeletal:        General: Normal range of motion.     Cervical back: Normal range of motion and neck supple.  Skin:    General: Skin is warm and dry.  Neurological:     Mental Status: He is alert and oriented to person, place, and time.     Coordination: Coordination normal.    ED Results / Procedures / Treatments   Labs (all labs ordered are listed, but only abnormal results are displayed) Labs Reviewed  COMPREHENSIVE METABOLIC PANEL - Abnormal; Notable for the following components:      Result Value   Sodium 133 (*)    CO2 21 (*)    Glucose, Bld 131 (*)    Creatinine, Ser 1.33 (*)    Calcium 8.8 (*)  Total Protein 6.4 (*)    AST 184 (*)    ALT 171 (*)    Alkaline Phosphatase 240 (*)    Total Bilirubin 2.6 (*)    All other components within normal limits  CBC - Abnormal; Notable for the following components:   WBC 1.9 (*)    Platelets 129 (*)    All other components within normal limits  LIPASE, BLOOD  URINALYSIS, ROUTINE W REFLEX MICROSCOPIC    EKG None  Radiology No results found.  Procedures Procedures    Medications Ordered in ED Medications  ondansetron (ZOFRAN) injection 4 mg (has no administration in time range)  morphine (PF) 4 MG/ML injection 4 mg (has no administration in time range)    ED Course/ Medical Decision Making/ A&P  This patient presents to the ED for concern of abdominal pain, this involves an extensive number of treatment options, and is a complaint that carries  with it a high risk of complications and morbidity.  The differential diagnosis includes acute pancreatitis, acute cholecystitis, small bowel obstruction, peptic ulcer disease.   Co morbidities that complicate the patient evaluation  None   Additional history obtained:  Additional history obtained from records from visit at [facility] from yesterday External records from outside source obtained and reviewed including ER notes and laboratory/radiology results   Lab Tests:  I Ordered, and personally interpreted labs.  The pertinent results include: White count of 1.9 along with elevations of his transaminases, alk phos, and total bilirubin   Imaging Studies ordered:  I ordered imaging studies including CT scan of the abdomen and pelvis I independently visualized and interpreted imaging which showed gallbladder wall thickening in the presence of a contracted gallbladder.  This is of undetermined significance I agree with the radiologist interpretation   Cardiac Monitoring: / EKG:  None performed  Consultations Obtained:  I requested consultation with the general surgeon, Dr. Rosendo Gros,  and discussed lab and imaging findings as well as pertinent plan - they recommend: Admission to the hospitalist service for work-up of what he feels to be hepatitis and not an urgently surgical process. I have also spoken with Dr. Myna Hidalgo from the hospitalist service who agrees to admit.   Problem List / ED Course / Critical interventions / Medication management  Patient presenting with abdominal pain as described in the HPI.  Laboratory studies show elevations of his alk phos, transaminases, and total bili along with a white count of 1.9.  I am uncertain as to the significance of these findings, but I did add on an acute hepatitis panel.  Care discussed with Dr. Rosendo Gros from general surgery who does not feel as though this is a surgical issue.  Care also discussed with Dr. Myna Hidalgo from the hospitalist  service and patient will be admitted for trending of his LFTs and likely GI consultation. I ordered medication including morphine and Zofran for pain and nausea Reevaluation of the patient after these medicines showed that the patient improved I have reviewed the patients home medicines and have made adjustments as needed   Social Determinants of Health:  None   Test / Admission - Considered:  Patient will be admitted to the hospitalist service for further evaluation.   Final Clinical Impression(s) / ED Diagnoses Final diagnoses:  None    Rx / DC Orders ED Discharge Orders     None         Veryl Speak, MD 02/07/22 [phone]

## 2022-02-07 NOTE — Consult Note (Signed)
Louis Leon 02-14-1961  [id number].    Requesting MD: Dr. Eulogio Bear Chief Complaint/Reason for Consult: Elevated LFT's  HPI: Louis Leon is a 61 y.o. male w/ a hx of SVT who presented to the ED w/ abdominal pain and nausea.  Patient was in his normal state of health till 5/27 when he began having headache, neck tightness, epigastric abdominal pain and nausea.  He reports the day prior to this he had some barbecue that no one else ate and thought his symptoms were related to this.  His symptoms persisted so he presented to [facility] on 5/30.  A CT head was done that was unremarkable and LP ruled out meningitis.  RUQ ultrasound was obtained that showed no gallstones but borderline gallbladder wall thickening with small volume pericholecystic fluid. LFT"s were noted to be elevated as well (AST 84, ALT 115, Alk Phos 194, T. Bili 0.7). EDP spoke w/ general surgery, Dr. Lucita Lora, who recommended outpatient follow-up with low suspicion for acute cholecystitis.  He was also tested for COVID, HSV, etc which were all negative.  Patient was feeling much better and was discharged from the ED.  He reports since returning home he tried some [employer] and began having increased epigastric abdominal pain with nausea but no vomiting.  Denies fever or chills.  Having normal bowel movements and denies diarrhea.  Again no one with similar symptoms. Denies hx of ulcers or frequent nsaid use. Came to [facility] ED on 5/31.  Noted to be afebrile without tachycardia or hypotension.  Noted to have leukopenia of 1.9, normal lipase, elevated LFT's (Alk Phos 240, AST 184, ALT 171, and T. Bili 2.6). CT A/P showed contracted GB w/ some GB wall thickening, no stones visualized. Admitted to [facility]. We were asked to see.  No prior abdominal surgeries.  No prior endoscopy or colonoscopy.  He is not on blood thinners.  ROS: ROS As above  Family History  Problem Relation Age of Onset   Stroke Mother    Dementia Mother    COPD  Father     Past Medical History:  Diagnosis Date   SVT (supraventricular tachycardia) (HCC)    Tachycardia     Past Surgical History:  Procedure Laterality Date   VASECTOMY      Social History:  reports that he has never smoked. He does not have any smokeless tobacco history on file. He reports that he does not currently use alcohol. He reports that he does not currently use drugs.  Allergies: No Known Allergies  Medications Prior to Admission  Medication Sig Dispense Refill   acetaminophen (TYLENOL) 500 MG tablet Take 1,000 mg by mouth every 6 (six) hours as needed for mild pain.     diltiazem (CARDIZEM) 60 MG tablet Take 60 mg by mouth 2 (two) times daily.     ibuprofen (ADVIL) 200 MG tablet Take 400 mg by mouth every 6 (six) hours as needed for mild pain.     metoprolol tartrate (LOPRESSOR) 25 MG tablet Take 25 mg by mouth 2 (two) times daily.     ondansetron (ZOFRAN-ODT) 4 MG disintegrating tablet Take 4 mg by mouth every 8 (eight) hours as needed for nausea/vomiting.       Physical Exam: Blood pressure 132/79, pulse 83, temperature 99.6 F (37.6 C), temperature source Oral, resp. rate 18, height 6' (1.829 m), weight 69.6 kg, SpO2 93 %. General: pleasant, WD/WN white male who is laying in bed with a pillow over his face to  avoid light. HEENT: head is normocephalic, atraumatic.  Sclera are noninjected.  PERRL.  Ears and nose without any masses or lesions.  Mouth is pink and moist. Dentition fair. Heart: regular, rate, and rhythm.  Normal s1,s2. No obvious murmurs, gallops, or rubs noted.  Palpable pedal pulses bilaterally  Lungs: CTAB, no wheezes, rhonchi, or rales noted.  Respiratory effort nonlabored Abd: Soft, ND, tender in the epigastrium and some in the LUQ.  No pain in the RUQ. +BS, no masses, hernias, or organomegaly MS: no BUE/BLE edema, calves soft and nontender Skin: warm and dry with no masses, lesions, or rashes Psych: A&Ox4 with an appropriate affect Neuro:  cranial nerves grossly intact, equal strength in BUE/BLE bilaterally, normal speech, except some stuttering but he says this is secondary to his frailty from this illness, thought process intact, moves all extremities, gait not assessed.   Results for orders placed or performed during the hospital encounter of 02/06/22 (from the past 48 hour(s))  Lipase, blood     Status: None   Collection Time: 02/06/22 10:29 PM  Result Value Ref Range   Lipase 32 11 - 51 U/L    Comment: Performed at [facility], [address], [place], [place] [address]  Comprehensive metabolic panel     Status: Abnormal   Collection Time: 02/06/22 10:29 PM  Result Value Ref Range   Sodium 133 (L) 135 - 145 mmol/L   Potassium 4.6 3.5 - 5.1 mmol/L   Chloride 102 98 - 111 mmol/L   CO2 21 (L) 22 - 32 mmol/L   Glucose, Bld 131 (H) 70 - 99 mg/dL    Comment: Glucose reference range applies only to samples taken after fasting for at least 8 hours.   BUN 19 8 - 23 mg/dL   Creatinine, Ser 1.33 (H) 0.61 - 1.24 mg/dL   Calcium 8.8 (L) 8.9 - 10.3 mg/dL   Total Protein 6.4 (L) 6.5 - 8.1 g/dL   Albumin 3.6 3.5 - 5.0 g/dL   AST 184 (H) 15 - 41 U/L   ALT 171 (H) 0 - 44 U/L   Alkaline Phosphatase 240 (H) 38 - 126 U/L   Total Bilirubin 2.6 (H) 0.3 - 1.2 mg/dL   GFR, Estimated >60 >60 mL/min    Comment: (NOTE) Calculated using the CKD-EPI Creatinine Equation (2021)    Anion gap 10 5 - 15    Comment: Performed at [facility], [place], [place] [address]  CBC     Status: Abnormal   Collection Time: 02/06/22 10:29 PM  Result Value Ref Range   WBC 1.9 (L) 4.0 - 10.5 K/uL   RBC 4.63 4.22 - 5.81 MIL/uL   Hemoglobin 13.9 13.0 - 17.0 g/dL   HCT 41.9 39.0 - 52.0 %   MCV 90.5 80.0 - 100.0 fL   MCH 30.0 26.0 - 34.0 pg   MCHC 33.2 30.0 - 36.0 g/dL   RDW 12.7 11.5 - 15.5 %   Platelets 129 (L) 150 - 400 K/uL    Comment: Immature Platelet Fraction may be clinically indicated,  consider ordering this additional test [id number]    nRBC 0.0 0.0 - 0.2 %    Comment: Performed at [facility], [address] [place], [place] [address]  Urinalysis, Routine w reflex microscopic     Status: Abnormal   Collection Time: 02/06/22 11:04 PM  Result Value Ref Range   Color, Urine YELLOW YELLOW   APPearance HAZY (A) CLEAR   Specific  Gravity, Urine 1.027 1.005 - 1.030   pH 6.0 5.0 - 8.0   Glucose, UA NEGATIVE NEGATIVE mg/dL   Hgb urine dipstick NEGATIVE NEGATIVE   Bilirubin Urine SMALL (A) NEGATIVE   Ketones, ur NEGATIVE NEGATIVE mg/dL   Protein, ur 30 (A) NEGATIVE mg/dL   Nitrite NEGATIVE NEGATIVE   Leukocytes,Ua NEGATIVE NEGATIVE   RBC / HPF 0-5 0 - 5 RBC/hpf   WBC, UA 6-10 0 - 5 WBC/hpf   Bacteria, UA RARE (A) NONE SEEN   Squamous Epithelial / LPF 0-5 0 - 5   Mucus PRESENT     Comment: Performed at [facility], [address] [place], [place] [address]  Comprehensive metabolic panel     Status: Abnormal   Collection Time: 02/07/22  6:40 AM  Result Value Ref Range   Sodium 136 135 - 145 mmol/L   Potassium 4.0 3.5 - 5.1 mmol/L   Chloride 101 98 - 111 mmol/L   CO2 27 22 - 32 mmol/L   Glucose, Bld 104 (H) 70 - 99 mg/dL    Comment: Glucose reference range applies only to samples taken after fasting for at least 8 hours.   BUN 19 8 - 23 mg/dL   Creatinine, Ser 1.27 (H) 0.61 - 1.24 mg/dL   Calcium 8.6 (L) 8.9 - 10.3 mg/dL   Total Protein 6.1 (L) 6.5 - 8.1 g/dL   Albumin 3.3 (L) 3.5 - 5.0 g/dL   AST 242 (H) 15 - 41 U/L   ALT 205 (H) 0 - 44 U/L   Alkaline Phosphatase 252 (H) 38 - 126 U/L   Total Bilirubin 3.1 (H) 0.3 - 1.2 mg/dL   GFR, Estimated >60 >60 mL/min    Comment: (NOTE) Calculated using the CKD-EPI Creatinine Equation (2021)    Anion gap 8 5 - 15    Comment: Performed at [facility], [place] [address] [place], [place] [address]  CBC with Differential/Platelet     Status: Abnormal   Collection  Time: 02/07/22  6:40 AM  Result Value Ref Range   WBC 2.0 (L) 4.0 - 10.5 K/uL   RBC 4.46 4.22 - 5.81 MIL/uL   Hemoglobin 13.7 13.0 - 17.0 g/dL   HCT 41.2 39.0 - 52.0 %   MCV 92.4 80.0 - 100.0 fL   MCH 30.7 26.0 - 34.0 pg   MCHC 33.3 30.0 - 36.0 g/dL   RDW 12.9 11.5 - 15.5 %   Platelets 121 (L) 150 - 400 K/uL    Comment: Immature Platelet Fraction may be clinically indicated, consider ordering this additional test [id number]    nRBC 0.0 0.0 - 0.2 %   Neutrophils Relative % 45 %   Neutro Abs 0.9 (L) 1.7 - 7.7 K/uL   Lymphocytes Relative 44 %   Lymphs Abs 0.9 0.7 - 4.0 K/uL   Monocytes Relative 9 %   Monocytes Absolute 0.2 0.1 - 1.0 K/uL   Eosinophils Relative 0 %   Eosinophils Absolute 0.0 0.0 - 0.5 K/uL   Basophils Relative 1 %   Basophils Absolute 0.0 0.0 - 0.1 K/uL   Immature Granulocytes 1 %   Abs Immature Granulocytes 0.01 0.00 - 0.07 K/uL   Reactive, Benign Lymphocytes PRESENT     Comment: Performed at [facility], [place] [address], [place], [place] [address]  Magnesium     Status: None   Collection Time: 02/07/22  6:40 AM  Result Value Ref Range   Magnesium 2.1 1.7 - 2.4 mg/dL  Comment: Performed at [facility], [place] [address] [place], [place] [address]  CBC     Status: Abnormal   Collection Time: 02/07/22  8:32 AM  Result Value Ref Range   WBC 2.0 (L) 4.0 - 10.5 K/uL   RBC 4.44 4.22 - 5.81 MIL/uL   Hemoglobin 13.7 13.0 - 17.0 g/dL   HCT 40.8 39.0 - 52.0 %   MCV 91.9 80.0 - 100.0 fL   MCH 30.9 26.0 - 34.0 pg   MCHC 33.6 30.0 - 36.0 g/dL   RDW 12.8 11.5 - 15.5 %   Platelets 120 (L) 150 - 400 K/uL    Comment: Immature Platelet Fraction may be clinically indicated, consider ordering this additional test [id number]    nRBC 0.0 0.0 - 0.2 %    Comment: Performed at [facility], [place] [address], [place], [place] [address]   CT ABDOMEN PELVIS W CONTRAST  Result Date: 02/07/2022 CLINICAL DATA:  Abdominal  pain, acute, nonlocalized EXAM: CT ABDOMEN AND PELVIS WITH CONTRAST TECHNIQUE: Multidetector CT imaging of the abdomen and pelvis was performed using the standard protocol following bolus administration of intravenous contrast. RADIATION DOSE REDUCTION: This exam was performed according to the departmental dose-optimization program which includes automated exposure control, adjustment of the mA and/or kV according to patient size and/or use of iterative reconstruction technique. CONTRAST:  171m OMNIPAQUE IOHEXOL 300 MG/ML  SOLN COMPARISON:  None Available. FINDINGS: Lower chest: No acute abnormality.  Dependent bibasilar atelectasis. Hepatobiliary: Gallbladder is contracted with apparent wall thickening. No focal hepatic abnormality. No biliary ductal dilatation. Pancreas: No focal abnormality or ductal dilatation. Spleen: No focal abnormality.  Normal size. Adrenals/Urinary Tract: Left renal parapelvic cysts. No hydronephrosis. Adrenal glands and urinary bladder unremarkable. Stomach/Bowel: Sigmoid diverticulosis. No active diverticulitis. Stomach and small bowel decompressed, unremarkable. Vascular/Lymphatic: No evidence of aneurysm or adenopathy. Aortic atherosclerosis. Reproductive: No visible focal abnormality. Other: Small amount of free fluid in the pelvis.  No free air. Musculoskeletal: No acute bony abnormality. IMPRESSION: Gallbladder is contracted, but there appears to be gallbladder wall thickening. If there is clinical concern for cholecystitis, this could be further evaluated with ultrasound. Small amount of free fluid in the pelvis of unknown etiology. Sigmoid diverticulosis. Electronically Signed   By: KRolm BaptiseM.D.   On: 02/07/2022 01:10    Anti-infectives (From admission, onward)    Start     Dose/Rate Route Frequency Ordered Stop   02/07/22 0915  piperacillin-tazobactam (ZOSYN) IVPB 3.375 g        3.375 g 12.5 mL/hr over 240 Minutes Intravenous Every 8 hours 02/07/22 0820          Assessment/Plan Epigastric Abdominal pain with elevated LFT's Patient has been seen and examined.  [facility] reviewed. Vitals, I/O, labs and imaging personally reviewed as well. This is a 61y.o. male who presents with epigastric abdominal pain and nausea x 6 days that has worsened over the last 24 hours.  [facility] and noted to have elevated LFTs with a RUQ ultrasound showing borderline gallbladder wall thickening with small volume pericholecystic fluid but no gallstones.  Admission labs with rising LFTs and CT AP showed contracted GB w/ some GB wall thickening but again no stones visualized.  Unclear the etiology of patient's abdominal pain and LFTs. Recommend MRCP to better evaluate the biliary tree, liver, gallbladder, pancreas while hepatitis panel pending and other labs pending.  I do not think he  needs emergency surgery at this time but will continue to follow as further work-up commences.  This is not clearly related to his gallbladder at this time.  He may ultimately benefit from a GI consult pending above work-up. Further recs to follow.  FEN - NPO, IVF per [facility] VTE - SCDs, okay for chemical prophylaxis from a general surgery standpoint ID - Zosyn per primary  I reviewed ED provider notes, hospitalist notes, last 24 h vitals and pain scores, last 48 h intake and output, last 24 h labs and trends, and last 24 h imaging results.   Henreitta Cea, [facility] 02/07/2022, 9:35 AM Please see Amion for pager number during day hours 7:00am-4:30pm

## 2022-02-07 NOTE — H&P (Signed)
History and Physical    Louis Leon DOA: 02/06/2022  I have briefly reviewed the patient's prior medical records in [place]  PCP: Timmothy Euler, MD (Inactive)  Patient coming from: home  Chief Complaint: nausea/abdominal pain  HPI: Louis Leon is a 61 y.o. male with medical history significant of SVT.  He went to [facility] on 5/30 with abdominal pain, HA.  Meningitis was suspected so he had a lumbar puncture and CT Scan of head due to headache.  There were also equivocal findings for cholecystitis versus inflammatory process from hepatitis General surgery was consulted who states it would be best for this patient to follow-up in clinic as it does not appear to be acute cholecystitis.  The patient was treated with Fioricet, Benadryl, Compazine and fluids with some improvement in HA.  He presented to [facility] on 5/31 with worsening nausea and abdominal pain after eating some [employer].    Last alcohol use 5/25.    In the ER [facility] he had a CT Scan that showed: Gallbladder is contracted, but there appears to be gallbladder wall thickening. If there is clinical concern for cholecystitis, this could be further evaluated with ultrasound    Review of Systems: As per HPI otherwise 10 point review of systems negative.   Past Medical History:  Diagnosis Date   SVT (supraventricular tachycardia) (HCC)    Tachycardia     Past Surgical History:  Procedure Laterality Date   VASECTOMY       reports that he has never smoked. He does not have any smokeless tobacco history on file. He reports that he has occasional alcohol use (last use 5/25). He reports that he does not currently use drugs.  No Known Allergies  Family History  Problem Relation Age of Onset   Stroke Mother    Dementia Mother    COPD Father     Prior to Admission medications   Medication Sig Start Date End Date Taking? Authorizing Provider  diltiazem (CARDIZEM) 30 MG tablet Take 1  tablet (30 mg total) by mouth 2 (two) times daily. 02/07/14   Lysbeth Penner, FNP  metoprolol tartrate (LOPRESSOR) 25 MG tablet Take 1 tablet (25 mg total) by mouth 2 (two) times daily. 02/07/14   Lysbeth Penner, FNP    Physical Exam: Vitals:   02/07/22 0300 02/07/22 0330 02/07/22 0400 02/07/22 0436  BP: 112/80 108/63 100/60 126/79  Pulse: 64 79 72 81  Resp: 16  18 16   Temp:    99.6 F (37.6 C)  TempSrc:    Oral  SpO2: 98% 97% 97% 98%  Weight:      Height:          Constitutional: in bed, uncomfortable appearing ENMT: Mucous membranes are moist. Posterior pharynx clear of any exudate or lesions. Neck: normal, supple, no masses, no thyromegaly Respiratory: clear to auscultation bilaterally, no wheezing, no crackles. Normal respiratory effort. No accessory muscle use.  Cardiovascular: Regular rate and rhythm, no murmurs / rubs / gallops. No extremity edema. 2+ pedal pulses.  Abdomen: + tenderness in epigastric and RUQ region, no masses palpated. Bowel sounds positive.  Musculoskeletal: no clubbing / cyanosis. Normal muscle tone.  Skin: no rashes, lesions, ulcers. No induration, warm to touch Neurologic: CN 2-12 grossly intact. Strength 5/5 in all 4.  Psychiatric: Normal judgment and insight. Alert and oriented x 3. Normal mood.   Labs on Admission: I have personally reviewed following labs and imaging studies  CBC: Recent Labs  Lab 02/06/22 2229 02/07/22 0640  WBC 1.9* 2.0*  NEUTROABS  --  0.9*  HGB 13.9 13.7  HCT 41.9 41.2  MCV 90.5 92.4  PLT 129* [id number]*   Basic Metabolic Panel: Recent Labs  Lab 02/06/22 2229 02/07/22 0640  NA 133* 136  K 4.6 4.0  CL 102 101  CO2 21* 27  GLUCOSE 131* 104*  BUN 19 19  CREATININE 1.33* 1.27*  CALCIUM 8.8* 8.6*  MG  --  2.1   GFR: Estimated Creatinine Clearance: 59.4 mL/min (A) (by C-G formula based on SCr of 1.27 mg/dL (H)). Liver Function Tests: Recent Labs  Lab 02/06/22 2229 02/07/22 0640  AST 184* 242*  ALT 171*  205*  ALKPHOS 240* 252*  BILITOT 2.6* 3.1*  PROT 6.4* 6.1*  ALBUMIN 3.6 3.3*   Recent Labs  Lab 02/06/22 2229  LIPASE 32   No results for input(s): AMMONIA in the last 168 hours. Coagulation Profile: No results for input(s): INR, PROTIME in the last 168 hours. Cardiac Enzymes: No results for input(s): CKTOTAL, CKMB, CKMBINDEX, TROPONINI in the last 168 hours. BNP (last 3 results) No results for input(s): PROBNP in the last 8760 hours. HbA1C: No results for input(s): HGBA1C in the last 72 hours. CBG: No results for input(s): GLUCAP in the last 168 hours. Lipid Profile: No results for input(s): CHOL, HDL, LDLCALC, TRIG, CHOLHDL, LDLDIRECT in the last 72 hours. Thyroid Function Tests: No results for input(s): TSH, T4TOTAL, FREET4, T3FREE, THYROIDAB in the last 72 hours. Anemia Panel: No results for input(s): VITAMINB12, FOLATE, FERRITIN, TIBC, IRON, RETICCTPCT in the last 72 hours. Urine analysis:    Component Value Date/Time   COLORURINE YELLOW 02/06/2022 2304   APPEARANCEUR HAZY (A) 02/06/2022 2304   LABSPEC 1.027 02/06/2022 2304   PHURINE 6.0 02/06/2022 2304   GLUCOSEU NEGATIVE 02/06/2022 2304   HGBUR NEGATIVE 02/06/2022 2304   BILIRUBINUR SMALL (A) 02/06/2022 2304   KETONESUR NEGATIVE 02/06/2022 2304   PROTEINUR 30 (A) 02/06/2022 2304   UROBILINOGEN 0.2 11/11/2008 2007   NITRITE NEGATIVE 02/06/2022 2304   LEUKOCYTESUR NEGATIVE 02/06/2022 2304     Radiological Exams on Admission: CT ABDOMEN PELVIS W CONTRAST  Result Date: 02/07/2022 CLINICAL DATA:  Abdominal pain, acute, nonlocalized EXAM: CT ABDOMEN AND PELVIS WITH CONTRAST TECHNIQUE: Multidetector CT imaging of the abdomen and pelvis was performed using the standard protocol following bolus administration of intravenous contrast. RADIATION DOSE REDUCTION: This exam was performed according to the departmental dose-optimization program which includes automated exposure control, adjustment of the mA and/or kV according  to patient size and/or use of iterative reconstruction technique. CONTRAST:  132mL OMNIPAQUE IOHEXOL 300 MG/ML  SOLN COMPARISON:  None Available. FINDINGS: Lower chest: No acute abnormality.  Dependent bibasilar atelectasis. Hepatobiliary: Gallbladder is contracted with apparent wall thickening. No focal hepatic abnormality. No biliary ductal dilatation. Pancreas: No focal abnormality or ductal dilatation. Spleen: No focal abnormality.  Normal size. Adrenals/Urinary Tract: Left renal parapelvic cysts. No hydronephrosis. Adrenal glands and urinary bladder unremarkable. Stomach/Bowel: Sigmoid diverticulosis. No active diverticulitis. Stomach and small bowel decompressed, unremarkable. Vascular/Lymphatic: No evidence of aneurysm or adenopathy. Aortic atherosclerosis. Reproductive: No visible focal abnormality. Other: Small amount of free fluid in the pelvis.  No free air. Musculoskeletal: No acute bony abnormality. IMPRESSION: Gallbladder is contracted, but there appears to be gallbladder wall thickening. If there is clinical concern for cholecystitis, this could be further evaluated with ultrasound. Small amount of free fluid in the pelvis of unknown etiology. Sigmoid diverticulosis. Electronically  Signed   By: Rolm Baptise M.D.   On: 02/07/2022 01:10     Assessment/Plan Principal Problem:   Elevated LFTs Active Problems:   AKI (acute kidney injury) ([place])   Abdominal pain    Elevated LFTs/abdominal pain -trend labs- trending up currently -CT Scan: Gallbladder is contracted, but there appears to be gallbladder wall thickening -GS consult- defer further imaging to them (U/S vs MRCP) -IV abx -viral hepatitis panel pending -symptomatic treatment  AKI -IVF while NPO -daily labs   DVT prophylaxis: heparin Code Status: full  Family Communication: none Disposition Plan: pending GB work up [employer] called: [facility]    Admission status: obs-- suspect will be here > 2 midnight though   At the point of  initial evaluation, it is my clinical opinion that admission for OBSERVATION is reasonable and necessary because the patient's presenting complaints in the context of their chronic conditions represent sufficient risk of deterioration or significant morbidity to constitute reasonable grounds for close observation in the hospital setting, but that the patient may be medically stable for discharge from the hospital within 24 to 48 hours.     Geradine Girt [facility]   How to contact the [facility] Attending or Consulting provider [place] or covering provider during after hours [place], for this patient?  Check the care team in [facility] and look for a) attending/consulting [facility] provider listed and b) the [facility] team listed Log into [email or web address] and use [place]'s universal password to access. If you do not have the password, please contact the hospital operator. Locate the [facility] provider you are looking for under [facility] and page to a number that you can be directly reached. If you still have difficulty reaching the provider, please page the [facility] (Director on Call) for the Hospitalists listed on amion for assistance.  02/07/2022, 8:21 AM

## 2022-02-08 ENCOUNTER — Observation Stay (HOSPITAL_COMMUNITY): Payer: BC Managed Care – PPO

## 2022-02-08 ENCOUNTER — Inpatient Hospital Stay (HOSPITAL_COMMUNITY): Payer: BC Managed Care – PPO

## 2022-02-08 DIAGNOSIS — I4891 Unspecified atrial fibrillation: Secondary | ICD-10-CM | POA: Diagnosis not present

## 2022-02-08 DIAGNOSIS — Z825 Family history of asthma and other chronic lower respiratory diseases: Secondary | ICD-10-CM | POA: Diagnosis not present

## 2022-02-08 DIAGNOSIS — N179 Acute kidney failure, unspecified: Secondary | ICD-10-CM | POA: Diagnosis present

## 2022-02-08 DIAGNOSIS — R1013 Epigastric pain: Secondary | ICD-10-CM | POA: Diagnosis present

## 2022-02-08 DIAGNOSIS — Z823 Family history of stroke: Secondary | ICD-10-CM | POA: Diagnosis not present

## 2022-02-08 DIAGNOSIS — K269 Duodenal ulcer, unspecified as acute or chronic, without hemorrhage or perforation: Secondary | ICD-10-CM | POA: Diagnosis present

## 2022-02-08 DIAGNOSIS — R7989 Other specified abnormal findings of blood chemistry: Secondary | ICD-10-CM | POA: Diagnosis present

## 2022-02-08 DIAGNOSIS — R9431 Abnormal electrocardiogram [ECG] [EKG]: Secondary | ICD-10-CM | POA: Diagnosis not present

## 2022-02-08 LAB — CBC
HCT: 39.9 % (ref 39.0–52.0)
Hemoglobin: 13.3 g/dL (ref 13.0–17.0)
MCH: 30.4 pg (ref 26.0–34.0)
MCHC: 33.3 g/dL (ref 30.0–36.0)
MCV: 91.3 fL (ref 80.0–100.0)
Platelets: 118 10*3/uL — ABNORMAL LOW (ref 150–400)
RBC: 4.37 MIL/uL (ref 4.22–5.81)
RDW: 12.7 % (ref 11.5–15.5)
WBC: 5.4 10*3/uL (ref 4.0–10.5)
nRBC: 0 % (ref 0.0–0.2)

## 2022-02-08 LAB — IRON AND TIBC
Iron: 32 ug/dL — ABNORMAL LOW (ref 45–182)
Saturation Ratios: 10 % — ABNORMAL LOW (ref 17.9–39.5)
TIBC: 327 ug/dL (ref 250–450)
UIBC: 295 ug/dL

## 2022-02-08 LAB — PROTIME-INR
INR: 1 (ref 0.8–1.2)
Prothrombin Time: 13.3 seconds (ref 11.4–15.2)

## 2022-02-08 LAB — FERRITIN: Ferritin: 1229 ng/mL — ABNORMAL HIGH (ref 24–336)

## 2022-02-08 LAB — COMPREHENSIVE METABOLIC PANEL
ALT: 306 U/L — ABNORMAL HIGH (ref 0–44)
AST: 379 U/L — ABNORMAL HIGH (ref 15–41)
Albumin: 2.9 g/dL — ABNORMAL LOW (ref 3.5–5.0)
Alkaline Phosphatase: 247 U/L — ABNORMAL HIGH (ref 38–126)
Anion gap: 7 (ref 5–15)
BUN: 22 mg/dL (ref 8–23)
CO2: 26 mmol/L (ref 22–32)
Calcium: 8.1 mg/dL — ABNORMAL LOW (ref 8.9–10.3)
Chloride: 103 mmol/L (ref 98–111)
Creatinine, Ser: 1.26 mg/dL — ABNORMAL HIGH (ref 0.61–1.24)
GFR, Estimated: >60 mL/min (ref >60)
Glucose, Bld: 107 mg/dL — ABNORMAL HIGH (ref 70–99)
Potassium: 3.9 mmol/L (ref 3.5–5.1)
Sodium: 136 mmol/L (ref 135–145)
Total Bilirubin: 3.7 mg/dL — ABNORMAL HIGH (ref 0.3–1.2)
Total Protein: 5.7 g/dL — ABNORMAL LOW (ref 6.5–8.1)

## 2022-02-08 MED ORDER — TECHNETIUM TC 99M MEBROFENIN IV KIT
7.5000 | PACK | Freq: Once | INTRAVENOUS | Status: AC | PRN
  Administered 2022-02-08: 7.5 via INTRAVENOUS

## 2022-02-08 MED ORDER — METOPROLOL TARTRATE 25 MG PO TABS
25.0000 mg | ORAL_TABLET | Freq: Two times a day (BID) | ORAL | Status: DC
Start: 2022-02-08 — End: 2022-02-08

## 2022-02-08 MED ORDER — MORPHINE SULFATE (PF) 4 MG/ML IV SOLN: 2.8000 mg | Freq: Once | INTRAVENOUS | Status: DC

## 2022-02-08 MED ORDER — PROCHLORPERAZINE EDISYLATE 10 MG/2ML IJ SOLN
5.0000 mg | Freq: Four times a day (QID) | INTRAMUSCULAR | Status: DC | PRN
  Administered 2022-02-08 (×2): 5 mg via INTRAVENOUS
  Filled 2022-02-08 (×3): qty 2

## 2022-02-08 MED ORDER — METOPROLOL TARTRATE 12.5 MG HALF TABLET
12.5000 mg | ORAL_TABLET | Freq: Two times a day (BID) | ORAL | Status: DC
  Administered 2022-02-08 – 2022-02-11 (×6): 12.5 mg via ORAL
  Filled 2022-02-08 (×7): qty 1

## 2022-02-08 MED ORDER — MORPHINE SULFATE (PF) 2 MG/ML IV SOLN
INTRAVENOUS | Status: AC
  Filled 2022-02-08: qty 2

## 2022-02-08 NOTE — Progress Notes (Signed)
       CROSS COVER NOTE  NAME: Louis Leon MRN: [id number] DOB : May 05, 1961    Date of Service   02/07/22  HPI/Events of Note   Routine EKG obtained overnight for qTC surveillance with antiemetics. EKG showed AFIB w RVR HR 122. Pt asymptomatic, BP stable. On chart review patient does have a history of AFIB. Cardizem has been held today due to patient being NPO.  Interventions   Plan: Give PM Cardizem        Bishop Limbo DNP, MHA, FNP-BC Nurse Practitioner [facility] Pager [phone]

## 2022-02-08 NOTE — Progress Notes (Signed)
Subjective: CC: Still some epigastric pain and nausea. 1 episode of emesis yesterday. Mainly complains of HA.  Reports no recent travel or sick contacts. No hx of GB issues, liver dysfunction or elevated lft's in the past. Reports drinks ~1 drink 2-3 times per week. Does drink more heavily usually 2-3 times a year on social occassions but it does not sound anything recently. No IV drug use. No new medications or supplements prior to onset of symptoms.   Objective: Vital signs in last 24 hours: Temp:  [98.9 F (37.2 C)-100 F (37.8 C)] 99.3 F (37.4 C) (06/02 0549) Pulse Rate:  [69-109] 69 (06/02 0549) Resp:  [18] 18 (06/02 0549) BP: (106-148)/(71-90) 106/71 (06/02 0549) SpO2:  [93 %-98 %] 95 % (06/02 0549) Last BM Date : 02/06/22  Intake/Output from previous day: 06/01 0701 - 06/02 0700 In: 1967.6 [I.V.:1805.7; IV Piggyback:161.9] Out: 700 [Urine:700] Intake/Output this shift: No intake/output data recorded.  PE: Gen:  Alert, NAD, pleasant Pulm:  Rate and effort normal Abd: Soft, mild distension, epigastric tenderness without peritonitis, +BS.  Psych: A&Ox3   Lab Results:  Recent Labs    02/07/22 0832 02/08/22 0324  WBC 2.0* 5.4  HGB 13.7 13.3  HCT 40.8 39.9  PLT 120* 118*   BMET Recent Labs    02/07/22 0640 02/08/22 0324  NA 136 136  K 4.0 3.9  CL 101 103  CO2 27 26  GLUCOSE 104* 107*  BUN 19 22  CREATININE 1.27* 1.26*  CALCIUM 8.6* 8.1*   PT/INR No results for input(s): LABPROT, INR in the last 72 hours. CMP     Component Value Date/Time   NA 136 02/08/2022 0324   K 3.9 02/08/2022 0324   CL 103 02/08/2022 0324   CO2 26 02/08/2022 0324   GLUCOSE 107 (H) 02/08/2022 0324   BUN 22 02/08/2022 0324   CREATININE 1.26 (H) 02/08/2022 0324   CALCIUM 8.1 (L) 02/08/2022 0324   PROT 5.7 (L) 02/08/2022 0324   ALBUMIN 2.9 (L) 02/08/2022 0324   AST 379 (H) 02/08/2022 0324   ALT 306 (H) 02/08/2022 0324   ALKPHOS 247 (H) 02/08/2022 0324   BILITOT 3.7  (H) 02/08/2022 0324   GFRNONAA >60 02/08/2022 0324   GFRAA  11/11/2008 2102    >60        The eGFR has been calculated using the MDRD equation. This calculation has not been validated in all clinical situations. eGFR's persistently <60 mL/min signify possible Chronic Kidney Disease.   Lipase     Component Value Date/Time   LIPASE 32 02/06/2022 2229    Studies/Results: CT ABDOMEN PELVIS W CONTRAST  Result Date: 02/07/2022 CLINICAL DATA:  Abdominal pain, acute, nonlocalized EXAM: CT ABDOMEN AND PELVIS WITH CONTRAST TECHNIQUE: Multidetector CT imaging of the abdomen and pelvis was performed using the standard protocol following bolus administration of intravenous contrast. RADIATION DOSE REDUCTION: This exam was performed according to the departmental dose-optimization program which includes automated exposure control, adjustment of the mA and/or kV according to patient size and/or use of iterative reconstruction technique. CONTRAST:  169mL OMNIPAQUE IOHEXOL 300 MG/ML  SOLN COMPARISON:  None Available. FINDINGS: Lower chest: No acute abnormality.  Dependent bibasilar atelectasis. Hepatobiliary: Gallbladder is contracted with apparent wall thickening. No focal hepatic abnormality. No biliary ductal dilatation. Pancreas: No focal abnormality or ductal dilatation. Spleen: No focal abnormality.  Normal size. Adrenals/Urinary Tract: Left renal parapelvic cysts. No hydronephrosis. Adrenal glands and urinary bladder unremarkable. Stomach/Bowel: Sigmoid diverticulosis. No  active diverticulitis. Stomach and small bowel decompressed, unremarkable. Vascular/Lymphatic: No evidence of aneurysm or adenopathy. Aortic atherosclerosis. Reproductive: No visible focal abnormality. Other: Small amount of free fluid in the pelvis.  No free air. Musculoskeletal: No acute bony abnormality. IMPRESSION: Gallbladder is contracted, but there appears to be gallbladder wall thickening. If there is clinical concern for  cholecystitis, this could be further evaluated with ultrasound. Small amount of free fluid in the pelvis of unknown etiology. Sigmoid diverticulosis. Electronically Signed   By: Rolm Baptise M.D.   On: 02/07/2022 01:10   MR 3D Recon At Scanner  Result Date: 02/07/2022 CLINICAL DATA:  A 61 year old male presents for evaluation of RIGHT upper quadrant pain and elevated liver function tests. EXAM: MRI ABDOMEN WITHOUT AND WITH CONTRAST (INCLUDING MRCP) TECHNIQUE: Multiplanar multisequence MR imaging of the abdomen was performed both before and after the administration of intravenous contrast. Heavily T2-weighted images of the biliary and pancreatic ducts were obtained, and three-dimensional MRCP images were rendered by post processing. CONTRAST:  39mL GADAVIST GADOBUTROL 1 MMOL/ML IV SOLN COMPARISON:  Abdominal sonogram of Feb 05, 2022. CT of the abdomen and pelvis of February 07, 2022. FINDINGS: Lower chest: No effusion. Basilar airspace disease likely atelectasis. Hepatobiliary: Gallbladder wall thickening is pronounced without substantial distension of the lumen or evidence of cholelithiasis. Lumen is only minimally distended over the exam of earlier the same date and remain surrounded by edema in the wall. No biliary duct dilation. Diminutive appearance of the common bile duct. No focal, suspicious hepatic lesion. Pancreas: Normal intrinsic T1 signal. No ductal dilation or sign of inflammation. No focal lesion. Spleen:  Normal. Adrenals/Urinary Tract:  Adrenal glands are normal. Symmetric renal enhancement without hydronephrosis or suspicious renal lesion. Stomach/Bowel: Unremarkable to the extent evaluated on abdominal MRI. Vascular/Lymphatic: No pathologically enlarged lymph nodes identified. No abdominal aortic aneurysm demonstrated. Portal vein is patent. Other: No ascites in the abdomen. Ascites noted in the pelvis on previous CT. Musculoskeletal: No suspicious bone lesions identified. IMPRESSION: Gallbladder wall  thickening and edema without substantial gallbladder distension, biliary duct dilation or cholelithiasis. Findings favor reactive changes from ongoing hepatitis or hepatic dysfunction. Findings could also be seen in the setting of cardiac dysfunction. If there are worsening of symptoms or further concern for acute biliary process, HIDA scan could add specificity as warranted. Electronically Signed   By: Zetta Bills M.D.   On: 02/07/2022 21:01   MR ABDOMEN MRCP W WO CONTAST  Result Date: 02/07/2022 CLINICAL DATA:  A 61 year old male presents for evaluation of RIGHT upper quadrant pain and elevated liver function tests. EXAM: MRI ABDOMEN WITHOUT AND WITH CONTRAST (INCLUDING MRCP) TECHNIQUE: Multiplanar multisequence MR imaging of the abdomen was performed both before and after the administration of intravenous contrast. Heavily T2-weighted images of the biliary and pancreatic ducts were obtained, and three-dimensional MRCP images were rendered by post processing. CONTRAST:  59mL GADAVIST GADOBUTROL 1 MMOL/ML IV SOLN COMPARISON:  Abdominal sonogram of Feb 05, 2022. CT of the abdomen and pelvis of February 07, 2022. FINDINGS: Lower chest: No effusion. Basilar airspace disease likely atelectasis. Hepatobiliary: Gallbladder wall thickening is pronounced without substantial distension of the lumen or evidence of cholelithiasis. Lumen is only minimally distended over the exam of earlier the same date and remain surrounded by edema in the wall. No biliary duct dilation. Diminutive appearance of the common bile duct. No focal, suspicious hepatic lesion. Pancreas: Normal intrinsic T1 signal. No ductal dilation or sign of inflammation. No focal lesion. Spleen:  Normal. Adrenals/Urinary  Tract:  Adrenal glands are normal. Symmetric renal enhancement without hydronephrosis or suspicious renal lesion. Stomach/Bowel: Unremarkable to the extent evaluated on abdominal MRI. Vascular/Lymphatic: No pathologically enlarged lymph nodes  identified. No abdominal aortic aneurysm demonstrated. Portal vein is patent. Other: No ascites in the abdomen. Ascites noted in the pelvis on previous CT. Musculoskeletal: No suspicious bone lesions identified. IMPRESSION: Gallbladder wall thickening and edema without substantial gallbladder distension, biliary duct dilation or cholelithiasis. Findings favor reactive changes from ongoing hepatitis or hepatic dysfunction. Findings could also be seen in the setting of cardiac dysfunction. If there are worsening of symptoms or further concern for acute biliary process, HIDA scan could add specificity as warranted. Electronically Signed   By: Zetta Bills M.D.   On: 02/07/2022 21:01    Anti-infectives: Anti-infectives (From admission, onward)    Start     Dose/Rate Route Frequency Ordered Stop   02/07/22 0915  piperacillin-tazobactam (ZOSYN) IVPB 3.375 g        3.375 g 12.5 mL/hr over 240 Minutes Intravenous Every 8 hours 02/07/22 0820          Assessment/Plan Epigastric Abdominal pain with elevated LFT's - Unclear etiology of the patients abdominal pain and LFT's. His RUQ ultrasound showing borderline gallbladder wall thickening with small volume pericholecystic fluid but no gallstones at [facility] on 5/30. Admission CT AP showed contracted GB w/ some GB wall thickening but again no stones visualized. MRCP showed gallbladder wall thickening and edema without substantial gallbladder distension, biliary duct dilation or cholelithiasis; radiology favors this being reactive changes. His LFT's are globally elevated today with T. Bili of 3.7. Hepatitis panel neg. I am not entirely convinced this his GB causing the abdominal pain and elevated LFT's given current imaging findings but will obtain HIDA to r/o Cholecystitis. Would recommend GI consultation to further workup other etiologies of elevated LFT's.  FEN - NPO for HIDA, IVF per [facility] VTE - SCDs, okay for chemical prophylaxis from a general  surgery standpoint ID - Zosyn per primary   LOS: 0 days    Jillyn Ledger , [facility] 02/08/2022, 8:00 AM Please see Amion for pager number during day hours 7:00am-4:30pm

## 2022-02-08 NOTE — Progress Notes (Signed)
Patient ID: Louis Leon, male   DOB: 19-Sep-1960, 61 y.o.   MRN: [id number]   HIDA showed no evidence of cystic duct obstruction r/o acute cholecystitis

## 2022-02-08 NOTE — Progress Notes (Addendum)
PROGRESS NOTE    Louis Leon  [id number] DOB: 1961-08-31 DOA: 02/06/2022 PCP: Timmothy Euler, MD (Inactive)    Brief Narrative:  Louis Leon is a 61 y.o. male with medical history significant of SVT.  He went to [facility] on 5/30 with abdominal pain, HA.  Meningitis was suspected so he had a lumbar puncture and CT Scan of head due to headache.  There were also equivocal findings for cholecystitis versus inflammatory process from hepatitis General surgery was consulted who states it would be best for this patient to follow-up in clinic as it does not appear to be acute cholecystitis.  The patient was treated with Fioricet, Benadryl, Compazine and fluids with some improvement in HA.  He presented to [facility] on 5/31 with worsening nausea and abdominal pain after eating some [employer].     Assessment and Plan: Elevated LFTs/abdominal pain/nausea -trend labs- trending up currently -CT Scan: Gallbladder is contracted, but there appears to be gallbladder wall thickening -GS consult appreciated: MRCP, HIDA scan pending -IV abx -viral hepatitis panel negative -recently removed 10 ticks: on < 24 hours, no rash: r/o RMSF, lyme -denies tylenol use -mono spot test -echo ordered to r/o CHF causing congestion -GI consult   AKI -IVF while NPO -daily labs     DVT prophylaxis: heparin injection 5,000 Units Start: 02/07/22 0915 SCDs Start: 02/07/22 0507    Code Status: Full Code Family Communication: at bedside  Disposition Plan:  Level of care: Med-Surg Status is: Observation The patient will require care spanning > 2 midnights and should be moved to inpatient because: needs further work up    Consultants:  [facility] GI   Subjective: Still feeling crummy and having nausea   Objective: Vitals:   02/07/22 2120 02/07/22 2255 02/08/22 0205 02/08/22 0549  BP: (!) 148/81 (!) 147/90 136/71 106/71  Pulse: 80 (!) 109 87 69  Resp: 18 18 18 18   Temp: 99 F (37.2 C) 99.8 F (37.7 C) 99.5  F (37.5 C) 99.3 F (37.4 C)  TempSrc: Oral Oral Oral Oral  SpO2: 95% 95% 98% 95%  Weight:      Height:        Intake/Output Summary (Last 24 hours) at 02/08/2022 0912 Last data filed at 02/08/2022 [id number] Gross per 24 hour  Intake 1967.56 ml  Output 700 ml  Net 1267.56 ml   Filed Weights   02/06/22 2215  Weight: 69.6 kg    Examination:   General: Appearance:    Well developed, well nourished male who appear uncomfortable     Lungs:      respirations unlabored  Heart:    Normal heart rate.    MS:   All extremities are intact.    Neurologic:   Awake, alert       Data Reviewed: I have personally reviewed following labs and imaging studies  CBC: Recent Labs  Lab 02/06/22 2229 02/07/22 0640 02/07/22 0832 02/08/22 0324  WBC 1.9* 2.0* 2.0* 5.4  NEUTROABS  --  0.9*  --   --   HGB 13.9 13.7 13.7 13.3  HCT 41.9 41.2 40.8 39.9  MCV 90.5 92.4 91.9 91.3  PLT 129* 121* 120* [id number]*   Basic Metabolic Panel: Recent Labs  Lab 02/06/22 2229 02/07/22 0640 02/08/22 0324  NA 133* 136 136  K 4.6 4.0 3.9  CL 102 101 103  CO2 21* 27 26  GLUCOSE 131* 104* 107*  BUN 19 19 22   CREATININE 1.33* 1.27* 1.26*  CALCIUM 8.8* 8.6* 8.1*  MG  --  2.1  --    GFR: Estimated Creatinine Clearance: 59.8 mL/min (A) (by C-G formula based on SCr of 1.26 mg/dL (H)). Liver Function Tests: Recent Labs  Lab 02/06/22 2229 02/07/22 0640 02/08/22 0324  AST 184* 249*  242* 379*  ALT 171* 212*  205* 306*  ALKPHOS 240* 237*  252* 247*  BILITOT 2.6* 3.1*  3.1* 3.7*  PROT 6.4* 5.9*  6.1* 5.7*  ALBUMIN 3.6 3.0*  3.3* 2.9*   Recent Labs  Lab 02/06/22 2229  LIPASE 32   No results for input(s): AMMONIA in the last 168 hours. Coagulation Profile: Recent Labs  Lab 02/08/22 0828  INR 1.0   Cardiac Enzymes: No results for input(s): CKTOTAL, CKMB, CKMBINDEX, TROPONINI in the last 168 hours. BNP (last 3 results) No results for input(s): PROBNP in the last 8760 hours. HbA1C: No results  for input(s): HGBA1C in the last 72 hours. CBG: No results for input(s): GLUCAP in the last 168 hours. Lipid Profile: No results for input(s): CHOL, HDL, LDLCALC, TRIG, CHOLHDL, LDLDIRECT in the last 72 hours. Thyroid Function Tests: No results for input(s): TSH, T4TOTAL, FREET4, T3FREE, THYROIDAB in the last 72 hours. Anemia Panel: No results for input(s): VITAMINB12, FOLATE, FERRITIN, TIBC, IRON, RETICCTPCT in the last 72 hours. Sepsis Labs: No results for input(s): PROCALCITON, LATICACIDVEN in the last 168 hours.  No results found for this or any previous visit (from the past 240 hour(s)).       Radiology Studies: CT ABDOMEN PELVIS W CONTRAST  Result Date: 02/07/2022 CLINICAL DATA:  Abdominal pain, acute, nonlocalized EXAM: CT ABDOMEN AND PELVIS WITH CONTRAST TECHNIQUE: Multidetector CT imaging of the abdomen and pelvis was performed using the standard protocol following bolus administration of intravenous contrast. RADIATION DOSE REDUCTION: This exam was performed according to the departmental dose-optimization program which includes automated exposure control, adjustment of the mA and/or kV according to patient size and/or use of iterative reconstruction technique. CONTRAST:  150mL OMNIPAQUE IOHEXOL 300 MG/ML  SOLN COMPARISON:  None Available. FINDINGS: Lower chest: No acute abnormality.  Dependent bibasilar atelectasis. Hepatobiliary: Gallbladder is contracted with apparent wall thickening. No focal hepatic abnormality. No biliary ductal dilatation. Pancreas: No focal abnormality or ductal dilatation. Spleen: No focal abnormality.  Normal size. Adrenals/Urinary Tract: Left renal parapelvic cysts. No hydronephrosis. Adrenal glands and urinary bladder unremarkable. Stomach/Bowel: Sigmoid diverticulosis. No active diverticulitis. Stomach and small bowel decompressed, unremarkable. Vascular/Lymphatic: No evidence of aneurysm or adenopathy. Aortic atherosclerosis. Reproductive: No visible focal  abnormality. Other: Small amount of free fluid in the pelvis.  No free air. Musculoskeletal: No acute bony abnormality. IMPRESSION: Gallbladder is contracted, but there appears to be gallbladder wall thickening. If there is clinical concern for cholecystitis, this could be further evaluated with ultrasound. Small amount of free fluid in the pelvis of unknown etiology. Sigmoid diverticulosis. Electronically Signed   By: Rolm Baptise M.D.   On: 02/07/2022 01:10   MR 3D Recon At Scanner  Result Date: 02/07/2022 CLINICAL DATA:  A 61 year old male presents for evaluation of RIGHT upper quadrant pain and elevated liver function tests. EXAM: MRI ABDOMEN WITHOUT AND WITH CONTRAST (INCLUDING MRCP) TECHNIQUE: Multiplanar multisequence MR imaging of the abdomen was performed both before and after the administration of intravenous contrast. Heavily T2-weighted images of the biliary and pancreatic ducts were obtained, and three-dimensional MRCP images were rendered by post processing. CONTRAST:  70mL GADAVIST GADOBUTROL 1 MMOL/ML IV SOLN COMPARISON:  Abdominal sonogram of Feb 05, 2022. CT of the abdomen and  pelvis of February 07, 2022. FINDINGS: Lower chest: No effusion. Basilar airspace disease likely atelectasis. Hepatobiliary: Gallbladder wall thickening is pronounced without substantial distension of the lumen or evidence of cholelithiasis. Lumen is only minimally distended over the exam of earlier the same date and remain surrounded by edema in the wall. No biliary duct dilation. Diminutive appearance of the common bile duct. No focal, suspicious hepatic lesion. Pancreas: Normal intrinsic T1 signal. No ductal dilation or sign of inflammation. No focal lesion. Spleen:  Normal. Adrenals/Urinary Tract:  Adrenal glands are normal. Symmetric renal enhancement without hydronephrosis or suspicious renal lesion. Stomach/Bowel: Unremarkable to the extent evaluated on abdominal MRI. Vascular/Lymphatic: No pathologically enlarged lymph  nodes identified. No abdominal aortic aneurysm demonstrated. Portal vein is patent. Other: No ascites in the abdomen. Ascites noted in the pelvis on previous CT. Musculoskeletal: No suspicious bone lesions identified. IMPRESSION: Gallbladder wall thickening and edema without substantial gallbladder distension, biliary duct dilation or cholelithiasis. Findings favor reactive changes from ongoing hepatitis or hepatic dysfunction. Findings could also be seen in the setting of cardiac dysfunction. If there are worsening of symptoms or further concern for acute biliary process, HIDA scan could add specificity as warranted. Electronically Signed   By: Zetta Bills M.D.   On: 02/07/2022 21:01   MR ABDOMEN MRCP W WO CONTAST  Result Date: 02/07/2022 CLINICAL DATA:  A 61 year old male presents for evaluation of RIGHT upper quadrant pain and elevated liver function tests. EXAM: MRI ABDOMEN WITHOUT AND WITH CONTRAST (INCLUDING MRCP) TECHNIQUE: Multiplanar multisequence MR imaging of the abdomen was performed both before and after the administration of intravenous contrast. Heavily T2-weighted images of the biliary and pancreatic ducts were obtained, and three-dimensional MRCP images were rendered by post processing. CONTRAST:  16mL GADAVIST GADOBUTROL 1 MMOL/ML IV SOLN COMPARISON:  Abdominal sonogram of Feb 05, 2022. CT of the abdomen and pelvis of February 07, 2022. FINDINGS: Lower chest: No effusion. Basilar airspace disease likely atelectasis. Hepatobiliary: Gallbladder wall thickening is pronounced without substantial distension of the lumen or evidence of cholelithiasis. Lumen is only minimally distended over the exam of earlier the same date and remain surrounded by edema in the wall. No biliary duct dilation. Diminutive appearance of the common bile duct. No focal, suspicious hepatic lesion. Pancreas: Normal intrinsic T1 signal. No ductal dilation or sign of inflammation. No focal lesion. Spleen:  Normal. Adrenals/Urinary  Tract:  Adrenal glands are normal. Symmetric renal enhancement without hydronephrosis or suspicious renal lesion. Stomach/Bowel: Unremarkable to the extent evaluated on abdominal MRI. Vascular/Lymphatic: No pathologically enlarged lymph nodes identified. No abdominal aortic aneurysm demonstrated. Portal vein is patent. Other: No ascites in the abdomen. Ascites noted in the pelvis on previous CT. Musculoskeletal: No suspicious bone lesions identified. IMPRESSION: Gallbladder wall thickening and edema without substantial gallbladder distension, biliary duct dilation or cholelithiasis. Findings favor reactive changes from ongoing hepatitis or hepatic dysfunction. Findings could also be seen in the setting of cardiac dysfunction. If there are worsening of symptoms or further concern for acute biliary process, HIDA scan could add specificity as warranted. Electronically Signed   By: Zetta Bills M.D.   On: 02/07/2022 21:01        Scheduled Meds:  diltiazem  60 mg Oral BID   heparin  5,000 Units Subcutaneous Q8H   metoprolol tartrate  12.5 mg Oral BID   Continuous Infusions:  sodium chloride 125 mL/hr at 02/08/22 0800   piperacillin-tazobactam (ZOSYN)  IV 3.375 g (02/08/22 0852)     LOS: 0 days  Time spent: 45 minutes spent on chart review, discussion with nursing staff, consultants, updating family and interview/physical exam; more than 50% of that time was spent in counseling and/or coordination of care.    Geradine Girt, DO [facility] Available via Epic secure chat 7am-7pm After these hours, please refer to coverage provider listed on [email or web address] 02/08/2022, 9:12 AM

## 2022-02-08 NOTE — Consult Note (Addendum)
Referring Provider: [facility] Primary Care Physician:  Timmothy Euler, MD (Inactive) Primary Gastroenterologist:  Althia Forts  Reason for Consultation:  Elevated LFTs  HPI: Louis Leon is a 61 y.o. male medical history significant of SVT presents for evaluation of elevated LFTs  Patient went to [facility] 5/30 for evaluation of abdominal pain and headache. Meningitis was suspected and patient underwent lumbar puncture and head CT. Findings for cholecystitis vs inflammatory process from hepatitis. RUQ US shows borderline gallbladder wall thickening with small volume pericholecystic fluid but no gallstones. General Surgery was consulted and recommend outpatient follow up. Patient was treated with Fioricet, benadryl, compazine, and fluids with improvement in headache.  He then presented to [facility] 5/31 with worsening abdominal pain and nausea after eating [employer]jangles. RUQ US shows borderline gallbladder wall thickening with small volume pericholecystic fluid but no gallstones. MRCP showed gallbladder wall thickening and edema without substantial gallbladder distension, biliary duct dilation or cholelithiasis.  Patient states he mainly has headache and neck pain. Has severe nausea, has not vomited since yesterday. Denies hematemesis. States last BM was a week ago. Reports previous episodes of nausea and vomiting with eating in the past that were not to this extent of severity. Denies weight loss. Denies previous colonoscopy/EGD. Denies family history of colon cancer. Occasional NSAID use. Denies tobacco use. Occasional alcohol use (liquor and beer a few times a week).   Past Medical History:  Diagnosis Date   SVT (supraventricular tachycardia) (HCC)    Tachycardia     Past Surgical History:  Procedure Laterality Date   VASECTOMY      Prior to Admission medications   Medication Sig Start Date End Date Taking? Authorizing Provider  acetaminophen (TYLENOL) 500 MG tablet Take 1,000 mg by mouth every 6  (six) hours as needed for mild pain.   Yes [provider]  diltiazem (CARDIZEM) 60 MG tablet Take 60 mg by mouth 2 (two) times daily. 01/26/22  Yes [provider]  ibuprofen (ADVIL) 200 MG tablet Take 400 mg by mouth every 6 (six) hours as needed for mild pain.   Yes [provider]  metoprolol tartrate (LOPRESSOR) 25 MG tablet Take 25 mg by mouth 2 (two) times daily.   Yes [provider]  ondansetron (ZOFRAN-ODT) 4 MG disintegrating tablet Take 4 mg by mouth every 8 (eight) hours as needed for nausea/vomiting. 02/05/22  Yes [provider]    Scheduled Meds:  diltiazem  60 mg Oral BID   heparin  5,000 Units Subcutaneous Q8H   metoprolol tartrate  12.5 mg Oral BID   Continuous Infusions:  sodium chloride 125 mL/hr at 02/08/22 0800   piperacillin-tazobactam (ZOSYN)  IV 3.375 g (02/08/22 0852)   PRN Meds:.acetaminophen **OR** acetaminophen, HYDROmorphone (DILAUDID) injection, naLOXone (NARCAN)  injection, ondansetron (ZOFRAN) IV, prochlorperazine  Allergies as of 02/06/2022   (No Known Allergies)    Family History  Problem Relation Age of Onset   Stroke Mother    Dementia Mother    COPD Father     Social History   Socioeconomic History   Marital status: Single    Spouse name: Not on file   Number of children: Not on file   Years of education: Not on file   Highest education level: Not on file  Occupational History   Not on file  Tobacco Use   Smoking status: Never   Smokeless tobacco: Not on file  Substance and Sexual Activity   Alcohol use: Not Currently   Drug use: Not Currently  Sexual activity: Not on file  Other Topics Concern   Not on file  Social History Narrative   Not on file   Social Determinants of Health   Financial Resource Strain: Not on file  Food Insecurity: Not on file  Transportation Needs: Not on file  Physical Activity: Not on file  Stress: Not on file  Social Connections: Not on file  Intimate  Partner Violence: Not on file    Review of Systems: Review of Systems  Constitutional:  Negative for chills, fever and weight loss.  HENT:  Negative for hearing loss and tinnitus.   Eyes:  Negative for blurred vision and double vision.  Respiratory:  Negative for sputum production and shortness of breath.   Cardiovascular:  Negative for chest pain and palpitations.  Gastrointestinal:  Positive for abdominal pain, nausea and vomiting. Negative for blood in stool, constipation, diarrhea, heartburn and melena.  Genitourinary:  Negative for dysuria and urgency.  Musculoskeletal:  Positive for neck pain. Negative for back pain.  Skin:  Negative for itching and rash.  Neurological:  Negative for seizures and loss of consciousness.       HA  Psychiatric/Behavioral:  Negative for depression and suicidal ideas.     Physical Exam:Physical Exam Constitutional:      Appearance: He is normal weight. He is ill-appearing.  HENT:     Head: Normocephalic and atraumatic.     Nose: Nose normal. No congestion.     Mouth/Throat:     Mouth: Mucous membranes are moist.     Pharynx: Oropharynx is clear.  Eyes:     Extraocular Movements: Extraocular movements intact.     Conjunctiva/sclera: Conjunctivae normal.  Cardiovascular:     Rate and Rhythm: Normal rate and regular rhythm.  Pulmonary:     Effort: Pulmonary effort is normal. No respiratory distress.  Abdominal:     General: Abdomen is flat. Bowel sounds are normal. There is no distension.     Palpations: Abdomen is soft. There is no mass.     Tenderness: There is no abdominal tenderness. There is no guarding or rebound.     Hernia: No hernia is present.  Musculoskeletal:        General: No swelling or tenderness. Normal range of motion.     Cervical back: Normal range of motion and neck supple.  Skin:    General: Skin is warm and dry.  Neurological:     General: No focal deficit present.     Mental Status: He is alert and oriented to  person, place, and time.  Psychiatric:        Mood and Affect: Mood normal.        Behavior: Behavior normal.        Thought Content: Thought content normal.        Judgment: Judgment normal.    Vital signs: Vitals:   02/08/22 0549 02/08/22 0944  BP: 106/71 122/73  Pulse: 69 70  Resp: 18   Temp: 99.3 F (37.4 C)   SpO2: 95% 97%   Last BM Date : 02/06/22    GI:  Lab Results: Recent Labs    02/07/22 0640 02/07/22 0832 02/08/22 0324  WBC 2.0* 2.0* 5.4  HGB 13.7 13.7 13.3  HCT 41.2 40.8 39.9  PLT 121* 120* 118*   BMET Recent Labs    02/06/22 2229 02/07/22 0640 02/08/22 0324  NA 133* 136 136  K 4.6 4.0 3.9  CL 102 101 103  CO2 21* 27 26  GLUCOSE 131* 104* 107*  BUN _0 CREATININE 1.33* 1.27* 1.26*  CALCIUM 8.8* 8.6* 8.1*   LFT Recent Labs    02/07/22 0640 02/08/22 0324  PROT 5.9*  6.1* 5.7*  ALBUMIN 3.0*  3.3* 2.9*  AST 249*  242* 379*  ALT 212*  205* 306*  ALKPHOS 237*  252* 247*  BILITOT 3.1*  3.1* 3.7*  BILIDIR 2.0*  --   IBILI 1.1*  --    PT/INR Recent Labs    02/08/22 0828  LABPROT 13.3  INR 1.0     Studies/Results: CT ABDOMEN PELVIS W CONTRAST  Result Date: 02/07/2022 CLINICAL DATA:  Abdominal pain, acute, nonlocalized EXAM: CT ABDOMEN AND PELVIS WITH CONTRAST TECHNIQUE: Multidetector CT imaging of the abdomen and pelvis was performed using the standard protocol following bolus administration of intravenous contrast. RADIATION DOSE REDUCTION: This exam was performed according to the departmental dose-optimization program which includes automated exposure control, adjustment of the mA and/or kV according to patient size and/or use of iterative reconstruction technique. CONTRAST:  127m OMNIPAQUE IOHEXOL 300 MG/ML  SOLN COMPARISON:  None Available. FINDINGS: Lower chest: No acute abnormality.  Dependent bibasilar atelectasis. Hepatobiliary: Gallbladder is contracted with apparent wall thickening. No focal hepatic abnormality. No  biliary ductal dilatation. Pancreas: No focal abnormality or ductal dilatation. Spleen: No focal abnormality.  Normal size. Adrenals/Urinary Tract: Left renal parapelvic cysts. No hydronephrosis. Adrenal glands and urinary bladder unremarkable. Stomach/Bowel: Sigmoid diverticulosis. No active diverticulitis. Stomach and small bowel decompressed, unremarkable. Vascular/Lymphatic: No evidence of aneurysm or adenopathy. Aortic atherosclerosis. Reproductive: No visible focal abnormality. Other: Small amount of free fluid in the pelvis.  No free air. Musculoskeletal: No acute bony abnormality. IMPRESSION: Gallbladder is contracted, but there appears to be gallbladder wall thickening. If there is clinical concern for cholecystitis, this could be further evaluated with ultrasound. Small amount of free fluid in the pelvis of unknown etiology. Sigmoid diverticulosis. Electronically Signed   By: KRolm BaptiseM.D.   On: 02/07/2022 01:10   MR 3D Recon At Scanner  Result Date: 02/07/2022 CLINICAL DATA:  A 61year old male presents for evaluation of RIGHT upper quadrant pain and elevated liver function tests. EXAM: MRI ABDOMEN WITHOUT AND WITH CONTRAST (INCLUDING MRCP) TECHNIQUE: Multiplanar multisequence MR imaging of the abdomen was performed both before and after the administration of intravenous contrast. Heavily T2-weighted images of the biliary and pancreatic ducts were obtained, and three-dimensional MRCP images were rendered by post processing. CONTRAST:  720mGADAVIST GADOBUTROL 1 MMOL/ML IV SOLN COMPARISON:  Abdominal sonogram of Feb 05, 2022. CT of the abdomen and pelvis of February 07, 2022. FINDINGS: Lower chest: No effusion. Basilar airspace disease likely atelectasis. Hepatobiliary: Gallbladder wall thickening is pronounced without substantial distension of the lumen or evidence of cholelithiasis. Lumen is only minimally distended over the exam of earlier the same date and remain surrounded by edema in the wall. No  biliary duct dilation. Diminutive appearance of the common bile duct. No focal, suspicious hepatic lesion. Pancreas: Normal intrinsic T1 signal. No ductal dilation or sign of inflammation. No focal lesion. Spleen:  Normal. Adrenals/Urinary Tract:  Adrenal glands are normal. Symmetric renal enhancement without hydronephrosis or suspicious renal lesion. Stomach/Bowel: Unremarkable to the extent evaluated on abdominal MRI. Vascular/Lymphatic: No pathologically enlarged lymph nodes identified. No abdominal aortic aneurysm demonstrated. Portal vein is patent. Other: No ascites in the abdomen. Ascites noted in the pelvis on previous CT. Musculoskeletal: No suspicious bone lesions identified. IMPRESSION: Gallbladder wall thickening and edema without substantial gallbladder  distension, biliary duct dilation or cholelithiasis. Findings favor reactive changes from ongoing hepatitis or hepatic dysfunction. Findings could also be seen in the setting of cardiac dysfunction. If there are worsening of symptoms or further concern for acute biliary process, HIDA scan could add specificity as warranted. Electronically Signed   By: Zetta Bills M.D.   On: 02/07/2022 21:01   MR ABDOMEN MRCP W WO CONTAST  Result Date: 02/07/2022 CLINICAL DATA:  A 61 year old male presents for evaluation of RIGHT upper quadrant pain and elevated liver function tests. EXAM: MRI ABDOMEN WITHOUT AND WITH CONTRAST (INCLUDING MRCP) TECHNIQUE: Multiplanar multisequence MR imaging of the abdomen was performed both before and after the administration of intravenous contrast. Heavily T2-weighted images of the biliary and pancreatic ducts were obtained, and three-dimensional MRCP images were rendered by post processing. CONTRAST:  39m GADAVIST GADOBUTROL 1 MMOL/ML IV SOLN COMPARISON:  Abdominal sonogram of Feb 05, 2022. CT of the abdomen and pelvis of February 07, 2022. FINDINGS: Lower chest: No effusion. Basilar airspace disease likely atelectasis. Hepatobiliary:  Gallbladder wall thickening is pronounced without substantial distension of the lumen or evidence of cholelithiasis. Lumen is only minimally distended over the exam of earlier the same date and remain surrounded by edema in the wall. No biliary duct dilation. Diminutive appearance of the common bile duct. No focal, suspicious hepatic lesion. Pancreas: Normal intrinsic T1 signal. No ductal dilation or sign of inflammation. No focal lesion. Spleen:  Normal. Adrenals/Urinary Tract:  Adrenal glands are normal. Symmetric renal enhancement without hydronephrosis or suspicious renal lesion. Stomach/Bowel: Unremarkable to the extent evaluated on abdominal MRI. Vascular/Lymphatic: No pathologically enlarged lymph nodes identified. No abdominal aortic aneurysm demonstrated. Portal vein is patent. Other: No ascites in the abdomen. Ascites noted in the pelvis on previous CT. Musculoskeletal: No suspicious bone lesions identified. IMPRESSION: Gallbladder wall thickening and edema without substantial gallbladder distension, biliary duct dilation or cholelithiasis. Findings favor reactive changes from ongoing hepatitis or hepatic dysfunction. Findings could also be seen in the setting of cardiac dysfunction. If there are worsening of symptoms or further concern for acute biliary process, HIDA scan could add specificity as warranted. Electronically Signed   By: GZetta BillsM.D.   On: 02/07/2022 21:01    Impression: Elevated LFTs - MRCP showed gallbladder wall thickening and edema without substantial gallbladder distension. No biliary duct dilation or cholelithiasis - RUQ UKoreashows borderline gallbladder wall thickening with small volume pericholecystic fluid but no gallstones.  -AST 379/ALT 306/alk phos 247 trending up - T. Bili 3.7 (3.1) - BUN 22, creatinine 1.26 - No leukocytosis - Platelets 119 - INR 1.0 - Hepatitis panel negative - HIDA scan ordered per surgery   Plan: Imaging shows gallbladder wall  thickening without CBD dilation. HIDA scan currently ordered per surgery. Unsure if patient will be able to tolerate HIDA scan with persistent nausea. With neurologic symptoms will check CMV, EBV. LFTs continue to trend up. Negative hepatitis panel. Will obtain LFT lab work up including alpha 1 antitrypsin, ceruloplasmin, iron studies, ASMA/AMA. Continue anti-emetics and supportive care [facility] will follow.    LOS: 0 days   BGarnette Scheuermann PA-C 02/08/2022, 10:47 AM  Contact #  [phone]

## 2022-02-08 NOTE — Plan of Care (Signed)
  Problem: Education: Goal: Knowledge of General Education information will improve Description: Including pain rating scale, medication(s)/side effects and non-pharmacologic comfort measures Outcome: Progressing   Problem: Pain Managment: Goal: General experience of comfort will improve Outcome: Progressing   

## 2022-02-09 ENCOUNTER — Inpatient Hospital Stay (HOSPITAL_COMMUNITY): Payer: BC Managed Care – PPO

## 2022-02-09 DIAGNOSIS — R9431 Abnormal electrocardiogram [ECG] [EKG]: Secondary | ICD-10-CM

## 2022-02-09 LAB — ECHOCARDIOGRAM COMPLETE
AR max vel: 3.01 cm2
AV Peak grad: 7.2 mmHg
Ao pk vel: 1.34 m/s
Area-P 1/2: 4.15 cm2
Calc EF: 64 %
Height: 72 in
MV M vel: 4.8 m/s
MV Peak grad: 92.2 mmHg
S' Lateral: 2.8 cm
Single Plane A2C EF: 64.3 %
Single Plane A4C EF: 63.8 %
Weight: 2462.1 oz

## 2022-02-09 LAB — CERULOPLASMIN: Ceruloplasmin: 31.7 mg/dL — ABNORMAL HIGH (ref 16.0–31.0)

## 2022-02-09 LAB — COMPREHENSIVE METABOLIC PANEL
ALT: 337 U/L — ABNORMAL HIGH (ref 0–44)
AST: 382 U/L — ABNORMAL HIGH (ref 15–41)
Albumin: 2.6 g/dL — ABNORMAL LOW (ref 3.5–5.0)
Alkaline Phosphatase: 278 U/L — ABNORMAL HIGH (ref 38–126)
Anion gap: 7 (ref 5–15)
BUN: 20 mg/dL (ref 8–23)
CO2: 25 mmol/L (ref 22–32)
Calcium: 8.2 mg/dL — ABNORMAL LOW (ref 8.9–10.3)
Chloride: 107 mmol/L (ref 98–111)
Creatinine, Ser: 0.98 mg/dL (ref 0.61–1.24)
GFR, Estimated: >60 mL/min (ref >60)
Glucose, Bld: 96 mg/dL (ref 70–99)
Potassium: 3.8 mmol/L (ref 3.5–5.1)
Sodium: 139 mmol/L (ref 135–145)
Total Bilirubin: 3.8 mg/dL — ABNORMAL HIGH (ref 0.3–1.2)
Total Protein: 5.5 g/dL — ABNORMAL LOW (ref 6.5–8.1)

## 2022-02-09 LAB — CMV ANTIBODY, IGG (EIA): CMV Ab - IgG: 7.9 U/mL — ABNORMAL HIGH (ref 0.00–0.59)

## 2022-02-09 LAB — ALPHA-1-ANTITRYPSIN: A-1 Antitrypsin, Ser: 194 mg/dL — ABNORMAL HIGH (ref 101–187)

## 2022-02-09 LAB — CMV IGM: CMV IgM: <30 AU/mL (ref 0.0–29.9)

## 2022-02-09 MED ORDER — ALUM & MAG HYDROXIDE-SIMETH 200-200-20 MG/5ML PO SUSP
30.0000 mL | ORAL | Status: DC | PRN
  Administered 2022-02-09: 30 mL via ORAL
  Filled 2022-02-09: qty 30

## 2022-02-09 NOTE — Progress Notes (Signed)
Subjective: Patient complains of generalized deconditioning, generalized body ache, fatigue, continued nausea and vomiting with continued upper abdominal pain.  Objective: Vital signs in last 24 hours: Temp:  [97.8 F (36.6 C)-99.3 F (37.4 C)] 98.4 F (36.9 C) (06/03 1321) Pulse Rate:  [55-73] 55 (06/03 1321) Resp:  [16-18] 16 (06/03 1321) BP: (103-124)/(62-76) 121/76 (06/03 1321) SpO2:  [96 %-99 %] 96 % (06/03 1321) Weight:  [69.8 kg] 69.8 kg (06/03 0500) Weight change:  Last BM Date : 02/06/22  PE: Ill-appearing GENERAL: Mildly icteric, no pallor  ABDOMEN: Upper abdominal tenderness, normoactive bowel sounds EXTREMITIES: No deformity  Lab Results: Results for orders placed or performed during the hospital encounter of 02/06/22 (from the past 48 hour(s))  Comprehensive metabolic panel     Status: Abnormal   Collection Time: 02/08/22  3:24 AM  Result Value Ref Range   Sodium 136 135 - 145 mmol/L   Potassium 3.9 3.5 - 5.1 mmol/L   Chloride 103 98 - 111 mmol/L   CO2 26 22 - 32 mmol/L   Glucose, Bld 107 (H) 70 - 99 mg/dL    Comment: Glucose reference range applies only to samples taken after fasting for at least 8 hours.   BUN 22 8 - 23 mg/dL   Creatinine, Ser 1.26 (H) 0.61 - 1.24 mg/dL   Calcium 8.1 (L) 8.9 - 10.3 mg/dL   Total Protein 5.7 (L) 6.5 - 8.1 g/dL   Albumin 2.9 (L) 3.5 - 5.0 g/dL   AST 379 (H) 15 - 41 U/L   ALT 306 (H) 0 - 44 U/L   Alkaline Phosphatase 247 (H) 38 - 126 U/L   Total Bilirubin 3.7 (H) 0.3 - 1.2 mg/dL   GFR, Estimated >60 >60 mL/min    Comment: (NOTE) Calculated using the CKD-EPI Creatinine Equation (2021)    Anion gap 7 5 - 15    Comment: Performed at [facility], [address] [place], [place] [address]  CBC     Status: Abnormal   Collection Time: 02/08/22  3:24 AM  Result Value Ref Range   WBC 5.4 4.0 - 10.5 K/uL   RBC 4.37 4.22 - 5.81 MIL/uL   Hemoglobin 13.3 13.0 - 17.0 g/dL   HCT 39.9 39.0 - 52.0 %   MCV 91.3  80.0 - 100.0 fL   MCH 30.4 26.0 - 34.0 pg   MCHC 33.3 30.0 - 36.0 g/dL   RDW 12.7 11.5 - 15.5 %   Platelets 118 (L) 150 - 400 K/uL    Comment: SPECIMEN CHECKED FOR CLOTS Immature Platelet Fraction may be clinically indicated, consider ordering this additional test [id number] REPEATED TO VERIFY PLATELET COUNT CONFIRMED BY SMEAR    nRBC 0.0 0.0 - 0.2 %    Comment: Performed at [facility], [address] [place], [place] [address]  Protime-INR     Status: None   Collection Time: 02/08/22  8:28 AM  Result Value Ref Range   Prothrombin Time 13.3 11.4 - 15.2 seconds   INR 1.0 0.8 - 1.2    Comment: (NOTE) INR goal varies based on device and disease states. Performed at [facility], [address] [place], [place] [address]   Ceruloplasmin     Status: Abnormal   Collection Time: 02/08/22 11:43 AM  Result Value Ref Range   Ceruloplasmin 31.7 (H) 16.0 - 31.0 mg/dL    Comment: (NOTE) Performed At: [facility] [address] [place],  [id number] Nagendra Sanjai MD   ZD:[id number]   Alpha-1-antitrypsin     Status: Abnormal   Collection Time: 02/08/22 11:43 AM  Result Value Ref Range   A-1 Antitrypsin, Ser 194 (H) 101 - 187 mg/dL    Comment: (NOTE) Performed At: [facility] [address] [place], [place] [id number] Jolene Schimke MD AY:[id number]   Ferritin     Status: Abnormal   Collection Time: 02/08/22 11:43 AM  Result Value Ref Range   Ferritin 1,229 (H) 24 - 336 ng/mL    Comment: Performed at [facility], [address], [place], [place] [address]  Iron and TIBC     Status: Abnormal   Collection Time: 02/08/22 11:43 AM  Result Value Ref Range   Iron 32 (L) 45 - 182 ug/dL   TIBC 220 254 - 270 ug/dL   Saturation Ratios 10 (L) 17.9 - 39.5 %   UIBC 295 ug/dL    Comment: Performed at [facility], [address], [place], [place] [address]  Comprehensive metabolic panel      Status: Abnormal   Collection Time: 02/09/22  7:33 AM  Result Value Ref Range   Sodium 139 135 - 145 mmol/L   Potassium 3.8 3.5 - 5.1 mmol/L   Chloride 107 98 - 111 mmol/L   CO2 25 22 - 32 mmol/L   Glucose, Bld 96 70 - 99 mg/dL    Comment: Glucose reference range applies only to samples taken after fasting for at least 8 hours.   BUN 20 8 - 23 mg/dL   Creatinine, Ser 2.83 0.61 - 1.24 mg/dL   Calcium 8.2 (L) 8.9 - 10.3 mg/dL   Total Protein 5.5 (L) 6.5 - 8.1 g/dL   Albumin 2.6 (L) 3.5 - 5.0 g/dL   AST 151 (H) 15 - 41 U/L   ALT 337 (H) 0 - 44 U/L   Alkaline Phosphatase 278 (H) 38 - 126 U/L   Total Bilirubin 3.8 (H) 0.3 - 1.2 mg/dL   GFR, Estimated >76 >16 mL/min    Comment: (NOTE) Calculated using the CKD-EPI Creatinine Equation (2021)    Anion gap 7 5 - 15    Comment: Performed at [facility], [address], [place], [place] [address]    Studies/Results: NM Hepatobiliary Liver Func  Result Date: 02/08/2022 CLINICAL DATA:  Nondiagnostic ultrasound, RIGHT upper quadrant pain, epigastric pain, nausea, vomiting, question cholecystitis EXAM: NUCLEAR MEDICINE HEPATOBILIARY IMAGING TECHNIQUE: Sequential images of the abdomen were obtained out to 60 minutes following intravenous administration of radiopharmaceutical. RADIOPHARMACEUTICALS:  7.5 mCi Tc-81m  Choletec IV COMPARISON:  CT abdomen and pelvis 02/07/2022, abdomen ultrasound RIGHT upper quadrant 02/05/2022 FINDINGS: Slightly delayed tracer extraction from bloodstream indicating mildly impaired hepatic function, with blood pool residual noted at 10 minutes. Delayed excretion of tracer into biliary tree. Small bowel visualized at 37 minutes. At 1 hour gallbladder had not visualized. Additional imaging for 30 minutes demonstrates visualization of the gallbladder beginning at 65 minutes. Findings are consistent with patent cystic duct and common bile duct. IMPRESSION: Patent cystic duct and common bile duct. No  scintigraphic evidence of acute cholecystitis. Electronically Signed   By: Ulyses Southward M.D.   On: 02/08/2022 15:44   MR 3D Recon At Scanner  Result Date: 02/07/2022 CLINICAL DATA:  A 61 year old male presents for evaluation of RIGHT upper quadrant pain and elevated liver function tests. EXAM: MRI ABDOMEN WITHOUT AND WITH CONTRAST (INCLUDING MRCP) TECHNIQUE: Multiplanar multisequence MR imaging of the abdomen was performed both before and after  the administration of intravenous contrast. Heavily T2-weighted images of the biliary and pancreatic ducts were obtained, and three-dimensional MRCP images were rendered by post processing. CONTRAST:  7mL GADAVIST GADOBUTROL 1 MMOL/ML IV SOLN COMPARISON:  Abdominal sonogram of Feb 05, 2022. CT of the abdomen and pelvis of February 07, 2022. FINDINGS: Lower chest: No effusion. Basilar airspace disease likely atelectasis. Hepatobiliary: Gallbladder wall thickening is pronounced without substantial distension of the lumen or evidence of cholelithiasis. Lumen is only minimally distended over the exam of earlier the same date and remain surrounded by edema in the wall. No biliary duct dilation. Diminutive appearance of the common bile duct. No focal, suspicious hepatic lesion. Pancreas: Normal intrinsic T1 signal. No ductal dilation or sign of inflammation. No focal lesion. Spleen:  Normal. Adrenals/Urinary Tract:  Adrenal glands are normal. Symmetric renal enhancement without hydronephrosis or suspicious renal lesion. Stomach/Bowel: Unremarkable to the extent evaluated on abdominal MRI. Vascular/Lymphatic: No pathologically enlarged lymph nodes identified. No abdominal aortic aneurysm demonstrated. Portal vein is patent. Other: No ascites in the abdomen. Ascites noted in the pelvis on previous CT. Musculoskeletal: No suspicious bone lesions identified. IMPRESSION: Gallbladder wall thickening and edema without substantial gallbladder distension, biliary duct dilation or  cholelithiasis. Findings favor reactive changes from ongoing hepatitis or hepatic dysfunction. Findings could also be seen in the setting of cardiac dysfunction. If there are worsening of symptoms or further concern for acute biliary process, HIDA scan could add specificity as warranted. Electronically Signed   By: Donzetta KohutGeoffrey  Wile M.D.   On: 02/07/2022 21:01   MR ABDOMEN MRCP W WO CONTAST  Result Date: 02/07/2022 CLINICAL DATA:  A 61 year old male presents for evaluation of RIGHT upper quadrant pain and elevated liver function tests. EXAM: MRI ABDOMEN WITHOUT AND WITH CONTRAST (INCLUDING MRCP) TECHNIQUE: Multiplanar multisequence MR imaging of the abdomen was performed both before and after the administration of intravenous contrast. Heavily T2-weighted images of the biliary and pancreatic ducts were obtained, and three-dimensional MRCP images were rendered by post processing. CONTRAST:  7mL GADAVIST GADOBUTROL 1 MMOL/ML IV SOLN COMPARISON:  Abdominal sonogram of Feb 05, 2022. CT of the abdomen and pelvis of February 07, 2022. FINDINGS: Lower chest: No effusion. Basilar airspace disease likely atelectasis. Hepatobiliary: Gallbladder wall thickening is pronounced without substantial distension of the lumen or evidence of cholelithiasis. Lumen is only minimally distended over the exam of earlier the same date and remain surrounded by edema in the wall. No biliary duct dilation. Diminutive appearance of the common bile duct. No focal, suspicious hepatic lesion. Pancreas: Normal intrinsic T1 signal. No ductal dilation or sign of inflammation. No focal lesion. Spleen:  Normal. Adrenals/Urinary Tract:  Adrenal glands are normal. Symmetric renal enhancement without hydronephrosis or suspicious renal lesion. Stomach/Bowel: Unremarkable to the extent evaluated on abdominal MRI. Vascular/Lymphatic: No pathologically enlarged lymph nodes identified. No abdominal aortic aneurysm demonstrated. Portal vein is patent. Other: No  ascites in the abdomen. Ascites noted in the pelvis on previous CT. Musculoskeletal: No suspicious bone lesions identified. IMPRESSION: Gallbladder wall thickening and edema without substantial gallbladder distension, biliary duct dilation or cholelithiasis. Findings favor reactive changes from ongoing hepatitis or hepatic dysfunction. Findings could also be seen in the setting of cardiac dysfunction. If there are worsening of symptoms or further concern for acute biliary process, HIDA scan could add specificity as warranted. Electronically Signed   By: Donzetta KohutGeoffrey  Wile M.D.   On: 02/07/2022 21:01    Medications: I have reviewed the patient's current medications.  Assessment: Abnormal LFTs, hepatocellular  dysfunction as well as cholestasis T. bili 3.8/AST 382/ALT 337/ALP 278  Elevated ferritin 1229, elevated ceruloplasmin, elevated alpha 1 antitrypsin-all reactive  Low iron saturation-although ferritin is elevated, I am not concerned about hemochromatosis  No coagulopathy, PT 13.1/INR 1, no encephalopathy, no evidence of liver failure Viral hepatitis A, B, C unremarkable  CT with IV contrast-contracted gallbladder, no focal liver abnormality, no biliary dilation MRI/MRCP-gallbladder wall thickening and edema possible ongoing hepatitis or hepatic dysfunction versus cardiac dysfunction HIDA scan-patent cystic and common bile duct, no evidence of acute cholecystitis  Plan: We will plan EGD tomorrow for ongoing nausea, vomiting and upper abdominal pain of unclear etiology.  Work-up pending for infectious mononucleosis/EBV, HSV, CMV  Kerin Salen, MD 02/09/2022, 1:24 PM

## 2022-02-09 NOTE — Progress Notes (Signed)
PROGRESS NOTE    Louis Leon  [id number] DOB: 04-May-1961 DOA: 02/06/2022 PCP: Timmothy Euler, MD (Inactive)    Brief Narrative:  Patient is a 60 years old Caucasian male with past medical history significant for SVT.  Patient was admitted with worsening nausea and abdominal pain.  Elevated LFTs have been found.  GI team is directing care.  For EGD tomorrow.  GI input is highly appreciated.    Assessment and Plan: Elevated LFTs/abdominal pain/nausea -trend labs- trending up currently -CT Scan: Gallbladder is contracted, but there appears to be gallbladder wall thickening -GS consult appreciated: MRCP, HIDA scan pending -IV abx -viral hepatitis panel negative -recently removed 10 ticks: on < 24 hours, no rash: r/o RMSF, lyme -denies tylenol use -mono spot test -echo ordered to r/o CHF causing congestion -GI consult 02/09/2022: Mild worsening of the liver enzymes.  AST today is 350 and ALT is 337.  Total protein is 5.5 with total bilirubin of 3.8.  For EGD in the morning.   AKI -Resolved.  DVT prophylaxis: heparin injection 5,000 Units Start: 02/07/22 0915 SCDs Start: 02/07/22 0507    Code Status: Full Code Family Communication: at bedside  Disposition Plan:  Level of care: Med-Surg Status is: Observation The patient will require care spanning > 2 midnights and should be moved to inpatient because: needs further work up    Consultants:  GS GI   Subjective: Patient continues to report nausea.   Objective: Vitals:   02/08/22 2202 02/09/22 0500 02/09/22 0603 02/09/22 1321  BP: 103/62  118/75 121/76  Pulse: 68  62 (!) 55  Resp:   16 16  Temp: 97.8 F (36.6 C)  98.6 F (37 C) 98.4 F (36.9 C)  TempSrc:    Oral  SpO2: 96%  96% 96%  Weight:  69.8 kg    Height:        Intake/Output Summary (Last 24 hours) at 02/09/2022 1345 Last data filed at 02/09/2022 0645 Gross per 24 hour  Intake 1407.35 ml  Output 1950 ml  Net -542.65 ml    Filed Weights   02/06/22  2215 02/09/22 0500  Weight: 69.6 kg 69.8 kg    Examination: General condition: Patient is very thin.  Not in any distress.  Awake and alert. HEENT: No pallor. Neck: Supple. CVS: S1-S2. Abdomen: Soft with vague tenderness.  Neuro exam is nonfocal. Lungs: Clear to auscultation. Extremities: No leg edema.   Data Reviewed: I have personally reviewed following labs and imaging studies  CBC: Recent Labs  Lab 02/06/22 2229 02/07/22 0640 02/07/22 0832 02/08/22 0324  WBC 1.9* 2.0* 2.0* 5.4  NEUTROABS  --  0.9*  --   --   HGB 13.9 13.7 13.7 13.3  HCT 41.9 41.2 40.8 39.9  MCV 90.5 92.4 91.9 91.3  PLT 129* 121* 120* 118*    Basic Metabolic Panel: Recent Labs  Lab 02/06/22 2229 02/07/22 0640 02/08/22 0324 02/09/22 0733  NA 133* 136 136 139  K 4.6 4.0 3.9 3.8  CL 102 101 103 107  CO2 21* 27 26 25   GLUCOSE 131* 104* 107* 96  BUN 19 19 22 20   CREATININE 1.33* 1.27* 1.26* 0.98  CALCIUM 8.8* 8.6* 8.1* 8.2*  MG  --  2.1  --   --     GFR: Estimated Creatinine Clearance: 77.2 mL/min (by C-G formula based on SCr of 0.98 mg/dL). Liver Function Tests: Recent Labs  Lab 02/06/22 2229 02/07/22 0640 02/08/22 0324 02/09/22 0733  AST 184* 249*  242* 379* 382*  ALT 171* 212*  205* 306* 337*  ALKPHOS 240* 237*  252* 247* 278*  BILITOT 2.6* 3.1*  3.1* 3.7* 3.8*  PROT 6.4* 5.9*  6.1* 5.7* 5.5*  ALBUMIN 3.6 3.0*  3.3* 2.9* 2.6*    Recent Labs  Lab 02/06/22 2229  LIPASE 32    No results for input(s): AMMONIA in the last 168 hours. Coagulation Profile: Recent Labs  Lab 02/08/22 0828  INR 1.0    Cardiac Enzymes: No results for input(s): CKTOTAL, CKMB, CKMBINDEX, TROPONINI in the last 168 hours. BNP (last 3 results) No results for input(s): PROBNP in the last 8760 hours. HbA1C: No results for input(s): HGBA1C in the last 72 hours. CBG: No results for input(s): GLUCAP in the last 168 hours. Lipid Profile: No results for input(s): CHOL, HDL, LDLCALC, TRIG,  CHOLHDL, LDLDIRECT in the last 72 hours. Thyroid Function Tests: No results for input(s): TSH, T4TOTAL, FREET4, T3FREE, THYROIDAB in the last 72 hours. Anemia Panel: Recent Labs    02/08/22 1143  FERRITIN 1,229*  TIBC 327  IRON 32*   Sepsis Labs: No results for input(s): PROCALCITON, LATICACIDVEN in the last 168 hours.  No results found for this or any previous visit (from the past 240 hour(s)).       Radiology Studies: NM Hepatobiliary Liver Func  Result Date: 02/08/2022 CLINICAL DATA:  Nondiagnostic ultrasound, RIGHT upper quadrant pain, epigastric pain, nausea, vomiting, question cholecystitis EXAM: NUCLEAR MEDICINE HEPATOBILIARY IMAGING TECHNIQUE: Sequential images of the abdomen were obtained out to 60 minutes following intravenous administration of radiopharmaceutical. RADIOPHARMACEUTICALS:  7.5 mCi Tc-25m  Choletec IV COMPARISON:  CT abdomen and pelvis 02/07/2022, abdomen ultrasound RIGHT upper quadrant 02/05/2022 FINDINGS: Slightly delayed tracer extraction from bloodstream indicating mildly impaired hepatic function, with blood pool residual noted at 10 minutes. Delayed excretion of tracer into biliary tree. Small bowel visualized at 37 minutes. At 1 hour gallbladder had not visualized. Additional imaging for 30 minutes demonstrates visualization of the gallbladder beginning at 65 minutes. Findings are consistent with patent cystic duct and common bile duct. IMPRESSION: Patent cystic duct and common bile duct. No scintigraphic evidence of acute cholecystitis. Electronically Signed   By: Lavonia Dana M.D.   On: 02/08/2022 15:44   MR 3D Recon At Scanner  Result Date: 02/07/2022 CLINICAL DATA:  A 61 year old male presents for evaluation of RIGHT upper quadrant pain and elevated liver function tests. EXAM: MRI ABDOMEN WITHOUT AND WITH CONTRAST (INCLUDING MRCP) TECHNIQUE: Multiplanar multisequence MR imaging of the abdomen was performed both before and after the administration of  intravenous contrast. Heavily T2-weighted images of the biliary and pancreatic ducts were obtained, and three-dimensional MRCP images were rendered by post processing. CONTRAST:  74mL GADAVIST GADOBUTROL 1 MMOL/ML IV SOLN COMPARISON:  Abdominal sonogram of Feb 05, 2022. CT of the abdomen and pelvis of February 07, 2022. FINDINGS: Lower chest: No effusion. Basilar airspace disease likely atelectasis. Hepatobiliary: Gallbladder wall thickening is pronounced without substantial distension of the lumen or evidence of cholelithiasis. Lumen is only minimally distended over the exam of earlier the same date and remain surrounded by edema in the wall. No biliary duct dilation. Diminutive appearance of the common bile duct. No focal, suspicious hepatic lesion. Pancreas: Normal intrinsic T1 signal. No ductal dilation or sign of inflammation. No focal lesion. Spleen:  Normal. Adrenals/Urinary Tract:  Adrenal glands are normal. Symmetric renal enhancement without hydronephrosis or suspicious renal lesion. Stomach/Bowel: Unremarkable to the extent evaluated on abdominal MRI. Vascular/Lymphatic: No pathologically enlarged lymph  nodes identified. No abdominal aortic aneurysm demonstrated. Portal vein is patent. Other: No ascites in the abdomen. Ascites noted in the pelvis on previous CT. Musculoskeletal: No suspicious bone lesions identified. IMPRESSION: Gallbladder wall thickening and edema without substantial gallbladder distension, biliary duct dilation or cholelithiasis. Findings favor reactive changes from ongoing hepatitis or hepatic dysfunction. Findings could also be seen in the setting of cardiac dysfunction. If there are worsening of symptoms or further concern for acute biliary process, HIDA scan could add specificity as warranted. Electronically Signed   By: Zetta Bills M.D.   On: 02/07/2022 21:01   MR ABDOMEN MRCP W WO CONTAST  Result Date: 02/07/2022 CLINICAL DATA:  A 61 year old male presents for evaluation of RIGHT  upper quadrant pain and elevated liver function tests. EXAM: MRI ABDOMEN WITHOUT AND WITH CONTRAST (INCLUDING MRCP) TECHNIQUE: Multiplanar multisequence MR imaging of the abdomen was performed both before and after the administration of intravenous contrast. Heavily T2-weighted images of the biliary and pancreatic ducts were obtained, and three-dimensional MRCP images were rendered by post processing. CONTRAST:  61mL GADAVIST GADOBUTROL 1 MMOL/ML IV SOLN COMPARISON:  Abdominal sonogram of Feb 05, 2022. CT of the abdomen and pelvis of February 07, 2022. FINDINGS: Lower chest: No effusion. Basilar airspace disease likely atelectasis. Hepatobiliary: Gallbladder wall thickening is pronounced without substantial distension of the lumen or evidence of cholelithiasis. Lumen is only minimally distended over the exam of earlier the same date and remain surrounded by edema in the wall. No biliary duct dilation. Diminutive appearance of the common bile duct. No focal, suspicious hepatic lesion. Pancreas: Normal intrinsic T1 signal. No ductal dilation or sign of inflammation. No focal lesion. Spleen:  Normal. Adrenals/Urinary Tract:  Adrenal glands are normal. Symmetric renal enhancement without hydronephrosis or suspicious renal lesion. Stomach/Bowel: Unremarkable to the extent evaluated on abdominal MRI. Vascular/Lymphatic: No pathologically enlarged lymph nodes identified. No abdominal aortic aneurysm demonstrated. Portal vein is patent. Other: No ascites in the abdomen. Ascites noted in the pelvis on previous CT. Musculoskeletal: No suspicious bone lesions identified. IMPRESSION: Gallbladder wall thickening and edema without substantial gallbladder distension, biliary duct dilation or cholelithiasis. Findings favor reactive changes from ongoing hepatitis or hepatic dysfunction. Findings could also be seen in the setting of cardiac dysfunction. If there are worsening of symptoms or further concern for acute biliary process, HIDA  scan could add specificity as warranted. Electronically Signed   By: Zetta Bills M.D.   On: 02/07/2022 21:01   ECHOCARDIOGRAM COMPLETE  Result Date: 02/09/2022    ECHOCARDIOGRAM REPORT   Patient Name:   LETHANIEL POCHE Date of Exam: 02/09/2022 Medical Rec #:  [id number]  Height:       72.0 in Accession #:    [id number] Weight:       153.9 lb Date of Birth:  1960/11/18   BSA:          1.906 m Patient Age:    46 years   BP:           118/75 mmHg Patient Gender: M          HR:           67 bpm. Exam Location:  Inpatient Procedure: 2D Echo, Cardiac Doppler and Color Doppler Indications:    Abnormal EKG  History:        Patient has no prior history of Echocardiogram examinations.  Sonographer:    Jyl Heinz Referring Phys: [place]  1. Left ventricular ejection fraction, by estimation, is  60 to 65%. The left ventricle has normal function. The left ventricle has no regional wall motion abnormalities. There is mild left ventricular hypertrophy of the basal-septal segment. Left ventricular diastolic parameters were normal.  2. Right ventricular systolic function is normal. The right ventricular size is normal. There is normal pulmonary artery systolic pressure. The estimated right ventricular systolic pressure is 0000000 mmHg.  3. The mitral valve is normal in structure. Mild mitral valve regurgitation. No evidence of mitral stenosis.  4. The aortic valve is tricuspid. Aortic valve regurgitation is mild to moderate. No aortic stenosis is present.  5. The inferior vena cava is dilated in size with >50% respiratory variability, suggesting right atrial pressure of 8 mmHg. FINDINGS  Left Ventricle: Left ventricular ejection fraction, by estimation, is 60 to 65%. The left ventricle has normal function. The left ventricle has no regional wall motion abnormalities. The left ventricular internal cavity size was normal in size. There is  mild left ventricular hypertrophy of the basal-septal segment. Left  ventricular diastolic parameters were normal. Normal left ventricular filling pressure. Right Ventricle: The right ventricular size is normal. No increase in right ventricular wall thickness. Right ventricular systolic function is normal. There is normal pulmonary artery systolic pressure. The tricuspid regurgitant velocity is 1.82 m/s, and  with an assumed right atrial pressure of 8 mmHg, the estimated right ventricular systolic pressure is 0000000 mmHg. Left Atrium: Left atrial size was normal in size. Right Atrium: Right atrial size was normal in size. Pericardium: There is no evidence of pericardial effusion. Mitral Valve: The mitral valve is normal in structure. Mild mitral valve regurgitation, with centrally-directed jet. No evidence of mitral valve stenosis. Tricuspid Valve: The tricuspid valve is normal in structure. Tricuspid valve regurgitation is trivial. No evidence of tricuspid stenosis. Aortic Valve: The aortic valve is tricuspid. Aortic valve regurgitation is mild to moderate. No aortic stenosis is present. Aortic valve peak gradient measures 7.2 mmHg. Pulmonic Valve: The pulmonic valve was normal in structure. Pulmonic valve regurgitation is trivial. No evidence of pulmonic stenosis. Aorta: The aortic root and ascending aorta are structurally normal, with no evidence of dilitation. Venous: The inferior vena cava is dilated in size with greater than 50% respiratory variability, suggesting right atrial pressure of 8 mmHg. IAS/Shunts: No atrial level shunt detected by color flow Doppler.  LEFT VENTRICLE PLAX 2D LVIDd:         4.40 cm      Diastology LVIDs:         2.80 cm      LV e' medial:    7.94 cm/s LV PW:         1.00 cm      LV E/e' medial:  8.2 LV IVS:        1.30 cm      LV e' lateral:   7.29 cm/s LVOT diam:     2.20 cm      LV E/e' lateral: 8.9 LV SV:         86 LV SV Index:   45 LVOT Area:     3.80 cm  LV Volumes (MOD) LV vol d, MOD A2C: 119.0 ml LV vol d, MOD A4C: 120.0 ml LV vol s, MOD A2C:  42.5 ml LV vol s, MOD A4C: 43.5 ml LV SV MOD A2C:     76.5 ml LV SV MOD A4C:     120.0 ml LV SV MOD BP:      76.7 ml RIGHT VENTRICLE  IVC RV Basal diam:  3.10 cm     IVC diam: 2.60 cm RV Mid diam:    2.80 cm RV S prime:     14.50 cm/s TAPSE (M-mode): 2.7 cm LEFT ATRIUM             Index        RIGHT ATRIUM           Index LA diam:        3.60 cm 1.89 cm/m   RA Area:     13.90 cm LA Vol (A2C):   70.9 ml 37.20 ml/m  RA Volume:   32.50 ml  17.05 ml/m LA Vol (A4C):   60.8 ml 31.90 ml/m LA Biplane Vol: 67.0 ml 35.15 ml/m  AORTIC VALVE                 PULMONIC VALVE AV Area (Vmax): 3.01 cm     PR End Diast Vel: 1.63 msec AV Vmax:        134.00 cm/s AV Peak Grad:   7.2 mmHg LVOT Vmax:      106.00 cm/s LVOT Vmean:     70.000 cm/s LVOT VTI:       0.226 m  AORTA Ao Root diam: 3.40 cm Ao Asc diam:  3.00 cm MITRAL VALVE               TRICUSPID VALVE MV Area (PHT): 4.15 cm    TR Peak grad:   13.2 mmHg MV Decel Time: 183 msec    TR Vmax:        182.00 cm/s MR Peak grad: 92.2 mmHg MR Vmax:      480.00 cm/s  SHUNTS MV E velocity: 65.10 cm/s  Systemic VTI:  0.23 m MV A velocity: 36.80 cm/s  Systemic Diam: 2.20 cm MV E/A ratio:  1.77 Mihai Croitoru MD Electronically signed by Sanda Klein MD Signature Date/Time: 02/09/2022/1:28:04 PM    Final         Scheduled Meds:  diltiazem  60 mg Oral BID   heparin  5,000 Units Subcutaneous Q8H   metoprolol tartrate  12.5 mg Oral BID   Continuous Infusions:  sodium chloride 125 mL/hr at 02/09/22 0821   piperacillin-tazobactam (ZOSYN)  IV 3.375 g (02/09/22 0822)     LOS: 1 day    Bonnell Public, MD [facility] Available via Epic secure chat 7am-7pm After these hours, please refer to coverage provider listed on [email or web address] 02/09/2022, 1:45 PM

## 2022-02-09 NOTE — Plan of Care (Signed)

## 2022-02-09 NOTE — Progress Notes (Addendum)
Subjective/Chief Complaint: Feeling slightly better, less abdominal discomfort   Objective: Vital signs in last 24 hours: Temp:  [97.8 F (36.6 C)-99.3 F (37.4 C)] 98.6 F (37 C) (06/03 0603) Pulse Rate:  [62-73] 62 (06/03 0603) Resp:  [16-18] 16 (06/03 0603) BP: (103-124)/(62-75) 118/75 (06/03 0603) SpO2:  [96 %-99 %] 96 % (06/03 0603) Weight:  [69.8 kg] 69.8 kg (06/03 0500) Last BM Date : 02/06/22  Intake/Output from previous day: 06/02 0701 - 06/03 0700 In: 1876.3 [P.O.:720; I.V.:1054.2; IV Piggyback:102.1] Out: 1950 [id number] Intake/Output this shift: No intake/output data recorded.  Exam: Awake, looks more comfortable Abdomen soft, mild tenderness RUQ, non-distended  Lab Results:  Recent Labs    02/07/22 0832 02/08/22 0324  WBC 2.0* 5.4  HGB 13.7 13.3  HCT 40.8 39.9  PLT 120* 118*   BMET Recent Labs    02/07/22 0640 02/08/22 0324  NA 136 136  K 4.0 3.9  CL 101 103  CO2 27 26  GLUCOSE 104* 107*  BUN 19 22  CREATININE 1.27* 1.26*  CALCIUM 8.6* 8.1*   PT/INR Recent Labs    02/08/22 0828  LABPROT 13.3  INR 1.0   ABG No results for input(s): PHART, HCO3 in the last 72 hours.  Invalid input(s): PCO2, PO2  Studies/Results: NM Hepatobiliary Liver Func  Result Date: 02/08/2022 CLINICAL DATA:  Nondiagnostic ultrasound, RIGHT upper quadrant pain, epigastric pain, nausea, vomiting, question cholecystitis EXAM: NUCLEAR MEDICINE HEPATOBILIARY IMAGING TECHNIQUE: Sequential images of the abdomen were obtained out to 60 minutes following intravenous administration of radiopharmaceutical. RADIOPHARMACEUTICALS:  7.5 mCi Tc-22m  Choletec IV COMPARISON:  CT abdomen and pelvis 02/07/2022, abdomen ultrasound RIGHT upper quadrant 02/05/2022 FINDINGS: Slightly delayed tracer extraction from bloodstream indicating mildly impaired hepatic function, with blood pool residual noted at 10 minutes. Delayed excretion of tracer into biliary tree. Small bowel visualized  at 37 minutes. At 1 hour gallbladder had not visualized. Additional imaging for 30 minutes demonstrates visualization of the gallbladder beginning at 65 minutes. Findings are consistent with patent cystic duct and common bile duct. IMPRESSION: Patent cystic duct and common bile duct. No scintigraphic evidence of acute cholecystitis. Electronically Signed   By: Lavonia Dana M.D.   On: 02/08/2022 15:44   MR 3D Recon At Scanner  Result Date: 02/07/2022 CLINICAL DATA:  A 61 year old male presents for evaluation of RIGHT upper quadrant pain and elevated liver function tests. EXAM: MRI ABDOMEN WITHOUT AND WITH CONTRAST (INCLUDING MRCP) TECHNIQUE: Multiplanar multisequence MR imaging of the abdomen was performed both before and after the administration of intravenous contrast. Heavily T2-weighted images of the biliary and pancreatic ducts were obtained, and three-dimensional MRCP images were rendered by post processing. CONTRAST:  53mL GADAVIST GADOBUTROL 1 MMOL/ML IV SOLN COMPARISON:  Abdominal sonogram of Feb 05, 2022. CT of the abdomen and pelvis of February 07, 2022. FINDINGS: Lower chest: No effusion. Basilar airspace disease likely atelectasis. Hepatobiliary: Gallbladder wall thickening is pronounced without substantial distension of the lumen or evidence of cholelithiasis. Lumen is only minimally distended over the exam of earlier the same date and remain surrounded by edema in the wall. No biliary duct dilation. Diminutive appearance of the common bile duct. No focal, suspicious hepatic lesion. Pancreas: Normal intrinsic T1 signal. No ductal dilation or sign of inflammation. No focal lesion. Spleen:  Normal. Adrenals/Urinary Tract:  Adrenal glands are normal. Symmetric renal enhancement without hydronephrosis or suspicious renal lesion. Stomach/Bowel: Unremarkable to the extent evaluated on abdominal MRI. Vascular/Lymphatic: No pathologically enlarged lymph nodes identified. No  abdominal aortic aneurysm demonstrated.  Portal vein is patent. Other: No ascites in the abdomen. Ascites noted in the pelvis on previous CT. Musculoskeletal: No suspicious bone lesions identified. IMPRESSION: Gallbladder wall thickening and edema without substantial gallbladder distension, biliary duct dilation or cholelithiasis. Findings favor reactive changes from ongoing hepatitis or hepatic dysfunction. Findings could also be seen in the setting of cardiac dysfunction. If there are worsening of symptoms or further concern for acute biliary process, HIDA scan could add specificity as warranted. Electronically Signed   By: Zetta Bills M.D.   On: 02/07/2022 21:01   MR ABDOMEN MRCP W WO CONTAST  Result Date: 02/07/2022 CLINICAL DATA:  A 61 year old male presents for evaluation of RIGHT upper quadrant pain and elevated liver function tests. EXAM: MRI ABDOMEN WITHOUT AND WITH CONTRAST (INCLUDING MRCP) TECHNIQUE: Multiplanar multisequence MR imaging of the abdomen was performed both before and after the administration of intravenous contrast. Heavily T2-weighted images of the biliary and pancreatic ducts were obtained, and three-dimensional MRCP images were rendered by post processing. CONTRAST:  40mL GADAVIST GADOBUTROL 1 MMOL/ML IV SOLN COMPARISON:  Abdominal sonogram of Feb 05, 2022. CT of the abdomen and pelvis of February 07, 2022. FINDINGS: Lower chest: No effusion. Basilar airspace disease likely atelectasis. Hepatobiliary: Gallbladder wall thickening is pronounced without substantial distension of the lumen or evidence of cholelithiasis. Lumen is only minimally distended over the exam of earlier the same date and remain surrounded by edema in the wall. No biliary duct dilation. Diminutive appearance of the common bile duct. No focal, suspicious hepatic lesion. Pancreas: Normal intrinsic T1 signal. No ductal dilation or sign of inflammation. No focal lesion. Spleen:  Normal. Adrenals/Urinary Tract:  Adrenal glands are normal. Symmetric renal  enhancement without hydronephrosis or suspicious renal lesion. Stomach/Bowel: Unremarkable to the extent evaluated on abdominal MRI. Vascular/Lymphatic: No pathologically enlarged lymph nodes identified. No abdominal aortic aneurysm demonstrated. Portal vein is patent. Other: No ascites in the abdomen. Ascites noted in the pelvis on previous CT. Musculoskeletal: No suspicious bone lesions identified. IMPRESSION: Gallbladder wall thickening and edema without substantial gallbladder distension, biliary duct dilation or cholelithiasis. Findings favor reactive changes from ongoing hepatitis or hepatic dysfunction. Findings could also be seen in the setting of cardiac dysfunction. If there are worsening of symptoms or further concern for acute biliary process, HIDA scan could add specificity as warranted. Electronically Signed   By: Zetta Bills M.D.   On: 02/07/2022 21:01    Anti-infectives: Anti-infectives (From admission, onward)    Start     Dose/Rate Route Frequency Ordered Stop   02/07/22 0915  piperacillin-tazobactam (ZOSYN) IVPB 3.375 g        3.375 g 12.5 mL/hr over 240 Minutes Intravenous Every 8 hours 02/07/22 0820         Assessment/Plan: Elevated LFT's  HIDA scan negative for cystic duct obstruction/cholecystitis.  Working ongoing for cause of elevated liver function tests.  Given MRCP and HIDA, gallbladder does not seem to be the cause.  No plans for surgery at this point. Will sign off for now.  Call us back if needed.   Coralie Keens MD 02/09/2022

## 2022-02-09 NOTE — H&P (View-Only) (Signed)
Subjective: Patient complains of generalized deconditioning, generalized body ache, fatigue, continued nausea and vomiting with continued upper abdominal pain.  Objective: Vital signs in last 24 hours: Temp:  [97.8 F (36.6 C)-99.3 F (37.4 C)] 98.4 F (36.9 C) (06/03 1321) Pulse Rate:  [55-73] 55 (06/03 1321) Resp:  [16-18] 16 (06/03 1321) BP: (103-124)/(62-76) 121/76 (06/03 1321) SpO2:  [96 %-99 %] 96 % (06/03 1321) Weight:  [69.8 kg] 69.8 kg (06/03 0500) Weight change:  Last BM Date : 02/06/22  PE: Ill-appearing GENERAL: Mildly icteric, no pallor  ABDOMEN: Upper abdominal tenderness, normoactive bowel sounds EXTREMITIES: No deformity  Lab Results: Results for orders placed or performed during the hospital encounter of 02/06/22 (from the past 48 hour(s))  Comprehensive metabolic panel     Status: Abnormal   Collection Time: 02/08/22  3:24 AM  Result Value Ref Range   Sodium 136 135 - 145 mmol/L   Potassium 3.9 3.5 - 5.1 mmol/L   Chloride 103 98 - 111 mmol/L   CO2 26 22 - 32 mmol/L   Glucose, Bld 107 (H) 70 - 99 mg/dL    Comment: Glucose reference range applies only to samples taken after fasting for at least 8 hours.   BUN 22 8 - 23 mg/dL   Creatinine, Ser 1.61 (H) 0.61 - 1.24 mg/dL   Calcium 8.1 (L) 8.9 - 10.3 mg/dL   Total Protein 5.7 (L) 6.5 - 8.1 g/dL   Albumin 2.9 (L) 3.5 - 5.0 g/dL   AST 096 (H) 15 - 41 U/L   ALT 306 (H) 0 - 44 U/L   Alkaline Phosphatase 247 (H) 38 - 126 U/L   Total Bilirubin 3.7 (H) 0.3 - 1.2 mg/dL   GFR, Estimated >04 >54 mL/min    Comment: (NOTE) Calculated using the CKD-EPI Creatinine Equation (2021)    Anion gap 7 5 - 15    Comment: Performed at [facility], [address], [place], [place] [address]  CBC     Status: Abnormal   Collection Time: 02/08/22  3:24 AM  Result Value Ref Range   WBC 5.4 4.0 - 10.5 K/uL   RBC 4.37 4.22 - 5.81 MIL/uL   Hemoglobin 13.3 13.0 - 17.0 g/dL   HCT 91.4 78.2 - 95.6 %   MCV 91.3  80.0 - 100.0 fL   MCH 30.4 26.0 - 34.0 pg   MCHC 33.3 30.0 - 36.0 g/dL   RDW 21.3 08.6 - 57.8 %   Platelets 118 (L) 150 - 400 K/uL    Comment: SPECIMEN CHECKED FOR CLOTS Immature Platelet Fraction may be clinically indicated, consider ordering this additional test [id number] REPEATED TO VERIFY PLATELET COUNT CONFIRMED BY SMEAR    nRBC 0.0 0.0 - 0.2 %    Comment: Performed at [facility], [address], [place], [place] [address]  Protime-INR     Status: None   Collection Time: 02/08/22  8:28 AM  Result Value Ref Range   Prothrombin Time 13.3 11.4 - 15.2 seconds   INR 1.0 0.8 - 1.2    Comment: (NOTE) INR goal varies based on device and disease states. Performed at [facility], [address], [place], [place] [address]   Ceruloplasmin     Status: Abnormal   Collection Time: 02/08/22 11:43 AM  Result Value Ref Range   Ceruloplasmin 31.7 (H) 16.0 - 31.0 mg/dL    Comment: (NOTE) Performed At: [facility] [address] [place], [place] [id number] Jolene Schimke MD  ZD:[id number]   Alpha-1-antitrypsin     Status: Abnormal   Collection Time: 02/08/22 11:43 AM  Result Value Ref Range   A-1 Antitrypsin, Ser 194 (H) 101 - 187 mg/dL    Comment: (NOTE) Performed At: [facility] [address] [place], [place] [id number] Jolene Schimke MD AY:[id number]   Ferritin     Status: Abnormal   Collection Time: 02/08/22 11:43 AM  Result Value Ref Range   Ferritin 1,229 (H) 24 - 336 ng/mL    Comment: Performed at [facility], [address], [place], [place] [address]  Iron and TIBC     Status: Abnormal   Collection Time: 02/08/22 11:43 AM  Result Value Ref Range   Iron 32 (L) 45 - 182 ug/dL   TIBC 220 254 - 270 ug/dL   Saturation Ratios 10 (L) 17.9 - 39.5 %   UIBC 295 ug/dL    Comment: Performed at [facility], [address], [place], [place] [address]  Comprehensive metabolic panel      Status: Abnormal   Collection Time: 02/09/22  7:33 AM  Result Value Ref Range   Sodium 139 135 - 145 mmol/L   Potassium 3.8 3.5 - 5.1 mmol/L   Chloride 107 98 - 111 mmol/L   CO2 25 22 - 32 mmol/L   Glucose, Bld 96 70 - 99 mg/dL    Comment: Glucose reference range applies only to samples taken after fasting for at least 8 hours.   BUN 20 8 - 23 mg/dL   Creatinine, Ser 2.83 0.61 - 1.24 mg/dL   Calcium 8.2 (L) 8.9 - 10.3 mg/dL   Total Protein 5.5 (L) 6.5 - 8.1 g/dL   Albumin 2.6 (L) 3.5 - 5.0 g/dL   AST 151 (H) 15 - 41 U/L   ALT 337 (H) 0 - 44 U/L   Alkaline Phosphatase 278 (H) 38 - 126 U/L   Total Bilirubin 3.8 (H) 0.3 - 1.2 mg/dL   GFR, Estimated >76 >16 mL/min    Comment: (NOTE) Calculated using the CKD-EPI Creatinine Equation (2021)    Anion gap 7 5 - 15    Comment: Performed at [facility], [address], [place], [place] [address]    Studies/Results: NM Hepatobiliary Liver Func  Result Date: 02/08/2022 CLINICAL DATA:  Nondiagnostic ultrasound, RIGHT upper quadrant pain, epigastric pain, nausea, vomiting, question cholecystitis EXAM: NUCLEAR MEDICINE HEPATOBILIARY IMAGING TECHNIQUE: Sequential images of the abdomen were obtained out to 60 minutes following intravenous administration of radiopharmaceutical. RADIOPHARMACEUTICALS:  7.5 mCi Tc-81m  Choletec IV COMPARISON:  CT abdomen and pelvis 02/07/2022, abdomen ultrasound RIGHT upper quadrant 02/05/2022 FINDINGS: Slightly delayed tracer extraction from bloodstream indicating mildly impaired hepatic function, with blood pool residual noted at 10 minutes. Delayed excretion of tracer into biliary tree. Small bowel visualized at 37 minutes. At 1 hour gallbladder had not visualized. Additional imaging for 30 minutes demonstrates visualization of the gallbladder beginning at 65 minutes. Findings are consistent with patent cystic duct and common bile duct. IMPRESSION: Patent cystic duct and common bile duct. No  scintigraphic evidence of acute cholecystitis. Electronically Signed   By: Ulyses Southward M.D.   On: 02/08/2022 15:44   MR 3D Recon At Scanner  Result Date: 02/07/2022 CLINICAL DATA:  A 61 year old male presents for evaluation of RIGHT upper quadrant pain and elevated liver function tests. EXAM: MRI ABDOMEN WITHOUT AND WITH CONTRAST (INCLUDING MRCP) TECHNIQUE: Multiplanar multisequence MR imaging of the abdomen was performed both before and after  the administration of intravenous contrast. Heavily T2-weighted images of the biliary and pancreatic ducts were obtained, and three-dimensional MRCP images were rendered by post processing. CONTRAST:  7mL GADAVIST GADOBUTROL 1 MMOL/ML IV SOLN COMPARISON:  Abdominal sonogram of Feb 05, 2022. CT of the abdomen and pelvis of February 07, 2022. FINDINGS: Lower chest: No effusion. Basilar airspace disease likely atelectasis. Hepatobiliary: Gallbladder wall thickening is pronounced without substantial distension of the lumen or evidence of cholelithiasis. Lumen is only minimally distended over the exam of earlier the same date and remain surrounded by edema in the wall. No biliary duct dilation. Diminutive appearance of the common bile duct. No focal, suspicious hepatic lesion. Pancreas: Normal intrinsic T1 signal. No ductal dilation or sign of inflammation. No focal lesion. Spleen:  Normal. Adrenals/Urinary Tract:  Adrenal glands are normal. Symmetric renal enhancement without hydronephrosis or suspicious renal lesion. Stomach/Bowel: Unremarkable to the extent evaluated on abdominal MRI. Vascular/Lymphatic: No pathologically enlarged lymph nodes identified. No abdominal aortic aneurysm demonstrated. Portal vein is patent. Other: No ascites in the abdomen. Ascites noted in the pelvis on previous CT. Musculoskeletal: No suspicious bone lesions identified. IMPRESSION: Gallbladder wall thickening and edema without substantial gallbladder distension, biliary duct dilation or  cholelithiasis. Findings favor reactive changes from ongoing hepatitis or hepatic dysfunction. Findings could also be seen in the setting of cardiac dysfunction. If there are worsening of symptoms or further concern for acute biliary process, HIDA scan could add specificity as warranted. Electronically Signed   By: Donzetta KohutGeoffrey  Wile M.D.   On: 02/07/2022 21:01   MR ABDOMEN MRCP W WO CONTAST  Result Date: 02/07/2022 CLINICAL DATA:  A 61 year old male presents for evaluation of RIGHT upper quadrant pain and elevated liver function tests. EXAM: MRI ABDOMEN WITHOUT AND WITH CONTRAST (INCLUDING MRCP) TECHNIQUE: Multiplanar multisequence MR imaging of the abdomen was performed both before and after the administration of intravenous contrast. Heavily T2-weighted images of the biliary and pancreatic ducts were obtained, and three-dimensional MRCP images were rendered by post processing. CONTRAST:  7mL GADAVIST GADOBUTROL 1 MMOL/ML IV SOLN COMPARISON:  Abdominal sonogram of Feb 05, 2022. CT of the abdomen and pelvis of February 07, 2022. FINDINGS: Lower chest: No effusion. Basilar airspace disease likely atelectasis. Hepatobiliary: Gallbladder wall thickening is pronounced without substantial distension of the lumen or evidence of cholelithiasis. Lumen is only minimally distended over the exam of earlier the same date and remain surrounded by edema in the wall. No biliary duct dilation. Diminutive appearance of the common bile duct. No focal, suspicious hepatic lesion. Pancreas: Normal intrinsic T1 signal. No ductal dilation or sign of inflammation. No focal lesion. Spleen:  Normal. Adrenals/Urinary Tract:  Adrenal glands are normal. Symmetric renal enhancement without hydronephrosis or suspicious renal lesion. Stomach/Bowel: Unremarkable to the extent evaluated on abdominal MRI. Vascular/Lymphatic: No pathologically enlarged lymph nodes identified. No abdominal aortic aneurysm demonstrated. Portal vein is patent. Other: No  ascites in the abdomen. Ascites noted in the pelvis on previous CT. Musculoskeletal: No suspicious bone lesions identified. IMPRESSION: Gallbladder wall thickening and edema without substantial gallbladder distension, biliary duct dilation or cholelithiasis. Findings favor reactive changes from ongoing hepatitis or hepatic dysfunction. Findings could also be seen in the setting of cardiac dysfunction. If there are worsening of symptoms or further concern for acute biliary process, HIDA scan could add specificity as warranted. Electronically Signed   By: Donzetta KohutGeoffrey  Wile M.D.   On: 02/07/2022 21:01    Medications: I have reviewed the patient's current medications.  Assessment: Abnormal LFTs, hepatocellular  dysfunction as well as cholestasis T. bili 3.8/AST 382/ALT 337/ALP 278  Elevated ferritin 1229, elevated ceruloplasmin, elevated alpha 1 antitrypsin-all reactive  Low iron saturation-although ferritin is elevated, I am not concerned about hemochromatosis  No coagulopathy, PT 13.1/INR 1, no encephalopathy, no evidence of liver failure Viral hepatitis A, B, C unremarkable  CT with IV contrast-contracted gallbladder, no focal liver abnormality, no biliary dilation MRI/MRCP-gallbladder wall thickening and edema possible ongoing hepatitis or hepatic dysfunction versus cardiac dysfunction HIDA scan-patent cystic and common bile duct, no evidence of acute cholecystitis  Plan: We will plan EGD tomorrow for ongoing nausea, vomiting and upper abdominal pain of unclear etiology.  Work-up pending for infectious mononucleosis/EBV, HSV, CMV  Kerin Salen, MD 02/09/2022, 1:24 PM

## 2022-02-10 ENCOUNTER — Inpatient Hospital Stay (HOSPITAL_COMMUNITY): Payer: BC Managed Care – PPO | Admitting: Anesthesiology

## 2022-02-10 ENCOUNTER — Encounter (HOSPITAL_COMMUNITY)
Admission: EM | Disposition: A | Discharge status: Home / Self Care | Payer: Self-pay | Source: Home / Self Care | Attending: Internal Medicine

## 2022-02-10 ENCOUNTER — Encounter (HOSPITAL_COMMUNITY): Payer: Self-pay | Admitting: Internal Medicine

## 2022-02-10 DIAGNOSIS — R7989 Other specified abnormal findings of blood chemistry: Secondary | ICD-10-CM | POA: Diagnosis not present

## 2022-02-10 HISTORY — PX: ESOPHAGOGASTRODUODENOSCOPY (EGD) WITH PROPOFOL: SHX5813

## 2022-02-10 HISTORY — PX: BIOPSY: SHX5522

## 2022-02-10 LAB — RENAL FUNCTION PANEL
Albumin: 2.6 g/dL — ABNORMAL LOW (ref 3.5–5.0)
Anion gap: 7 (ref 5–15)
BUN: 19 mg/dL (ref 8–23)
CO2: 26 mmol/L (ref 22–32)
Calcium: 8.1 mg/dL — ABNORMAL LOW (ref 8.9–10.3)
Chloride: 106 mmol/L (ref 98–111)
Creatinine, Ser: 1 mg/dL (ref 0.61–1.24)
GFR, Estimated: >60 mL/min (ref >60)
Glucose, Bld: 91 mg/dL (ref 70–99)
Phosphorus: 2.2 mg/dL — ABNORMAL LOW (ref 2.5–4.6)
Potassium: 3.8 mmol/L (ref 3.5–5.1)
Sodium: 139 mmol/L (ref 135–145)

## 2022-02-10 SURGERY — ESOPHAGOGASTRODUODENOSCOPY (EGD) WITH PROPOFOL
Anesthesia: Monitor Anesthesia Care

## 2022-02-10 MED ORDER — PROPOFOL 10 MG/ML IV BOLUS
INTRAVENOUS | Status: DC | PRN
  Administered 2022-02-10 (×2): 50 mg via INTRAVENOUS

## 2022-02-10 MED ORDER — LIDOCAINE 2% (20 MG/ML) 5 ML SYRINGE
INTRAMUSCULAR | Status: DC | PRN
  Administered 2022-02-10: 100 mg via INTRAVENOUS

## 2022-02-10 MED ORDER — PROPOFOL 500 MG/50ML IV EMUL
INTRAVENOUS | Status: DC | PRN
  Administered 2022-02-10: 125 ug/kg/min via INTRAVENOUS

## 2022-02-10 MED ORDER — LACTATED RINGERS IV SOLN: INTRAVENOUS | Status: DC | PRN

## 2022-02-10 MED ORDER — PANTOPRAZOLE SODIUM 40 MG PO TBEC
40.0000 mg | DELAYED_RELEASE_TABLET | Freq: Every day | ORAL | Status: DC
  Administered 2022-02-10 – 2022-02-11 (×2): 40 mg via ORAL
  Filled 2022-02-10 (×3): qty 1

## 2022-02-10 MED ORDER — SODIUM CHLORIDE 0.9 % IV SOLN: INTRAVENOUS | Status: DC

## 2022-02-10 SURGICAL SUPPLY — 15 items

## 2022-02-10 NOTE — Interval H&P Note (Signed)
History and Physical Interval Note: 60/male with nausea, vomiting and upper abdominal pain for EGD with propofol.  02/10/2022 8:26 AM  Louis Leon  has presented today for EGD with the diagnosis of nausea, vomiting, upper abdominal pain.  The various methods of treatment have been discussed with the patient and family. After consideration of risks, benefits and other options for treatment, the patient has consented to  Procedure(s): ESOPHAGOGASTRODUODENOSCOPY (EGD) WITH PROPOFOL (N/A) as a surgical intervention.  The patient's history has been reviewed, patient examined, no change in status, stable for surgery.  I have reviewed the patient's chart and labs.  Questions were answered to the patient's satisfaction.     Kerin Salen

## 2022-02-10 NOTE — Anesthesia Postprocedure Evaluation (Signed)
Anesthesia Post Note  Patient: Louis Leon  Procedure(s) Performed: ESOPHAGOGASTRODUODENOSCOPY (EGD) WITH PROPOFOL BIOPSY     Patient location during evaluation: PACU Anesthesia Type: MAC Level of consciousness: awake and alert Pain management: pain level controlled Vital Signs Assessment: post-procedure vital signs reviewed and stable Respiratory status: spontaneous breathing, nonlabored ventilation, respiratory function stable and patient connected to nasal cannula oxygen Cardiovascular status: stable and blood pressure returned to baseline Postop Assessment: no apparent nausea or vomiting Anesthetic complications: no   No notable events documented.  Last Vitals:  Vitals:   02/10/22 0846 02/10/22 0900  BP: 130/79 132/81  Pulse: 60 (!) 54  Resp: 18 19  Temp: 36.7 C   SpO2: 100% 98%    Last Pain:  Vitals:   02/10/22 0900  TempSrc:   PainSc: 0-No pain                 Tiajuana Amass

## 2022-02-10 NOTE — Anesthesia Procedure Notes (Signed)
Procedure Name: MAC Date/Time: 02/10/2022 8:36 AM Performed by: Renato Shin, CRNA Pre-anesthesia Checklist: Patient identified, Emergency Drugs available, Suction available and Patient being monitored Patient Re-evaluated:Patient Re-evaluated prior to induction Oxygen Delivery Method: Simple face mask Preoxygenation: Pre-oxygenation with 100% oxygen Induction Type: IV induction Placement Confirmation: positive ETCO2 and breath sounds checked- equal and bilateral Dental Injury: Teeth and Oropharynx as per pre-operative assessment

## 2022-02-10 NOTE — Plan of Care (Signed)

## 2022-02-10 NOTE — Transfer of Care (Signed)
Immediate Anesthesia Transfer of Care Note  Patient: Louis Leon  Procedure(s) Performed: ESOPHAGOGASTRODUODENOSCOPY (EGD) WITH PROPOFOL BIOPSY  Patient Location: PACU  Anesthesia Type:MAC  Level of Consciousness: awake and patient cooperative  Airway & Oxygen Therapy: Patient Spontanous Breathing and Patient connected to face mask oxygen  Post-op Assessment: Report given to RN and Post -op Vital signs reviewed and stable  Post vital signs: Reviewed and stable  Last Vitals:  Vitals Value Taken Time  BP 130/79 02/10/22 0846  Temp    Pulse 61 02/10/22 0847  Resp 17 02/10/22 0847  SpO2 100 % 02/10/22 0847  Vitals shown include unvalidated device data.  Last Pain:  Vitals:   02/10/22 0846  TempSrc:   PainSc: 0-No pain      Patients Stated Pain Goal: 2 (56/43/32 9518)  Complications: No notable events documented.

## 2022-02-10 NOTE — Plan of Care (Signed)
  Problem: Education: Goal: Knowledge of General Education information will improve Description: Including pain rating scale, medication(s)/side effects and non-pharmacologic comfort measures Outcome: Progressing   Problem: Pain Managment: Goal: General experience of comfort will improve Outcome: Progressing   Problem: Safety: Goal: Ability to remain free from injury will improve Outcome: Progressing   

## 2022-02-10 NOTE — Plan of Care (Signed)
  Problem: Education: Goal: Knowledge of General Education information will improve Description: Including pain rating scale, medication(s)/side effects and non-pharmacologic comfort measures Outcome: Progressing   Problem: Activity: Goal: Risk for activity intolerance will decrease Outcome: Progressing   Problem: Pain Managment: Goal: General experience of comfort will improve Outcome: Progressing   

## 2022-02-10 NOTE — Op Note (Signed)
[facility] Patient Name: Louis Leon Procedure Date: 02/10/2022 MRN: [id number] Attending MD: Ronnette Juniper , MD Date of Birth: 1961-06-26 CSN: [id number] Age: 61 Admit Type: Inpatient Procedure:                Upper GI endoscopy Indications:              Upper abdominal pain, Nausea with vomiting Providers:                Ronnette Juniper, MD, Grace Isaac, RN, Benetta Spar,                            Technician Referring MD:             [facility] Medicines:                Monitored Anesthesia Care Complications:            No immediate complications. Estimated blood loss:                            Minimal. Estimated Blood Loss:     Estimated blood loss was minimal. Procedure:                Pre-Anesthesia Assessment:                           - Prior to the procedure, a History and Physical                            was performed, and patient medications and                            allergies were reviewed. The patient's tolerance of                            previous anesthesia was also reviewed. The risks                            and benefits of the procedure and the sedation                            options and risks were discussed with the patient.                            All questions were answered, and informed consent                            was obtained. Prior Anticoagulants: The patient has                            taken no previous anticoagulant or antiplatelet                            agents. ASA Grade Assessment: II - A patient with  mild systemic disease. After reviewing the risks                            and benefits, the patient was deemed in                            satisfactory condition to undergo the procedure.                           After obtaining informed consent, the endoscope was                            passed under direct vision. Throughout the                            procedure, the  patient's blood pressure, pulse, and                            oxygen saturations were monitored continuously. The                            [id number] Olympus endoscope was introduced                            through the mouth, and advanced to the second part                            of duodenum. The upper GI endoscopy was                            accomplished without difficulty. The patient                            tolerated the procedure well. Scope In: Scope Out: Findings:      The upper third of the esophagus, middle third of the esophagus and       lower third of the esophagus were normal.      Localized mild mucosal changes characterized by friability (with contact       bleeding) were found at the gastroesophageal junction. Biopsies were       taken with a cold forceps for histology.      The entire examined stomach was normal. Biopsies were taken with a cold       forceps for Helicobacter pylori testing.      The cardia and gastric fundus were normal on retroflexion.      One non-bleeding superficial duodenal ulcer with a clean ulcer base       (Forrest Class III) was found in the duodenal bulb. The lesion was 3 mm       in largest dimension. Biopsies for histology were taken with a cold       forceps for evaluation of celiac disease. Impression:               - Normal upper third of esophagus, middle third of  esophagus and lower third of esophagus.                           - Friable (with contact bleeding) mucosa in the                            esophagus. Biopsied.                           - Normal stomach. Biopsied.                           - Non-bleeding duodenal ulcer with a clean ulcer                            base (Forrest Class III). Biopsied. Moderate Sedation:      Patient did not receive moderate sedation for this procedure, but       instead received monitored anesthesia care. Recommendation:           - Resume  regular diet.                           - Use Protonix (pantoprazole) 40 mg PO daily for 1                            month.                           - Await pathology results. Procedure Code(s):        --- Professional ---                           [phone], Esophagogastroduodenoscopy, flexible,                            transoral; with biopsy, single or multiple Diagnosis Code(s):        --- Professional ---                           K22.8, Other specified diseases of esophagus                           K26.9, Duodenal ulcer, unspecified as acute or                            chronic, without hemorrhage or perforation                           R10.10, Upper abdominal pain, unspecified                           R11.2, Nausea with vomiting, unspecified CPT copyright 2019 [employer]. All rights reserved. The codes documented in this report are preliminary and upon coder review may  be revised to meet current compliance requirements. Ronnette Juniper, MD 02/10/2022 8:44:53 AM This report has been signed electronically. Number of Addenda: 0

## 2022-02-10 NOTE — Anesthesia Preprocedure Evaluation (Signed)
Anesthesia Evaluation  Patient identified by MRN, date of birth, ID band Patient awake    Reviewed: Allergy & Precautions, NPO status , Patient's Chart, lab work & pertinent test results  Airway Mallampati: II  TM Distance: >3 FB Neck ROM: Full    Dental  (+) Dental Advisory Given   Pulmonary neg pulmonary ROS,    breath sounds clear to auscultation       Cardiovascular hypertension, Pt. on home beta blockers and Pt. on medications  Rhythm:Regular Rate:Normal     Neuro/Psych negative neurological ROS     GI/Hepatic negative GI ROS, Neg liver ROS,   Endo/Other  negative endocrine ROS  Renal/GU Renal disease     Musculoskeletal   Abdominal   Peds  Hematology negative hematology ROS (+)   Anesthesia Other Findings   Reproductive/Obstetrics                             Anesthesia Physical Anesthesia Plan  ASA: 2  Anesthesia Plan: MAC   Post-op Pain Management: Minimal or no pain anticipated   Induction:   PONV Risk Score and Plan: 1 and Propofol infusion and Treatment may vary due to age or medical condition  Airway Management Planned: Natural Airway and Nasal Cannula  Additional Equipment:   Intra-op Plan:   Post-operative Plan:   Informed Consent: I have reviewed the patients History and Physical, chart, labs and discussed the procedure including the risks, benefits and alternatives for the proposed anesthesia with the patient or authorized representative who has indicated his/her understanding and acceptance.       Plan Discussed with:   Anesthesia Plan Comments:         Anesthesia Quick Evaluation

## 2022-02-10 NOTE — Progress Notes (Signed)
PROGRESS NOTE    Louis Leon  [id number] DOB: 1960-11-28 DOA: 02/06/2022 PCP: Timmothy Euler, MD (Inactive)    Brief Narrative:  Patient is a 61 years old Caucasian male with past medical history significant for SVT.  Patient was admitted with worsening nausea,, abdominal pain and elevated liver enzymes.  Patient is currently being worked up for possible etiology of acute elevation of liver enzymes.  Imaging studies have not been very revealing.  Echocardiogram is nonrevealing.  CMV came back negative.  EBV, HSV and Lyme titers are pending.  Patient underwent EGD today that revealed small duodenal.  Patient has been started on PPI.  Patient continues to have nausea.  GI team is directing patient's care.  Hopefully, patient will be discharged in the next 24 hours if patient remains stable.    Assessment and Plan: Elevated LFTs/abdominal pain/nausea -trend labs- trending up currently -CT Scan: Gallbladder is contracted, but there appears to be gallbladder wall thickening -GS consult appreciated: MRCP, HIDA scan pending -IV abx -viral hepatitis panel negative -recently removed 10 ticks: on < 24 hours, no rash: r/o RMSF, lyme -denies tylenol use -mono spot test -echo ordered to r/o CHF causing congestion -GI consult 02/09/2022: Mild worsening of the liver enzymes.  AST today is 350 and ALT is 337.  Total protein is 5.5 with total bilirubin of 3.8.  For EGD in the morning. 02/10/2022: Check LFTs in the morning.  Follow results of EBV, HSV and Lyme titers.  Possible discharge in the morning.   AKI -Resolved.  DVT prophylaxis: heparin injection 5,000 Units Start: 02/07/22 0915 SCDs Start: 02/07/22 0507    Code Status: Full Code Family Communication: at bedside  Disposition Plan:  Level of care: Med-Surg Status is: Observation The patient will require care spanning > 2 midnights and should be moved to inpatient because: needs further work up    Consultants:   GS GI   Subjective: Intermittent nausea.    Objective: Vitals:   02/10/22 0600 02/10/22 0755 02/10/22 0846 02/10/22 0900  BP: (!) 104/54 (!) 142/72 130/79 132/81  Pulse: (!) 56 60 60 (!) 54  Resp: 18 13 18 19   Temp: 98.3 F (36.8 C) 98.2 F (36.8 C) 98 F (36.7 C)   TempSrc: Oral Temporal Temporal   SpO2: 96% 97% 100% 98%  Weight:      Height:        Intake/Output Summary (Last 24 hours) at 02/10/2022 1018 Last data filed at 02/10/2022 0847 Gross per 24 hour  Intake 3105.84 ml  Output 1400 ml  Net 1705.84 ml    Filed Weights   02/06/22 2215 02/09/22 0500 02/10/22 0500  Weight: 69.6 kg 69.8 kg 70.2 kg    Examination: General condition: Patient is very thin.  Not in any distress.  Awake and alert. HEENT: No pallor. Neck: Supple. CVS: S1-S2. Abdomen: Soft with vague tenderness.  Neuro exam is nonfocal. Lungs: Clear to auscultation. Extremities: No leg edema.   Data Reviewed: I have personally reviewed following labs and imaging studies  CBC: Recent Labs  Lab 02/06/22 2229 02/07/22 0640 02/07/22 0832 02/08/22 0324  WBC 1.9* 2.0* 2.0* 5.4  NEUTROABS  --  0.9*  --   --   HGB 13.9 13.7 13.7 13.3  HCT 41.9 41.2 40.8 39.9  MCV 90.5 92.4 91.9 91.3  PLT 129* 121* 120* 118*    Basic Metabolic Panel: Recent Labs  Lab 02/06/22 2229 02/07/22 0640 02/08/22 0324 02/09/22 0733 02/10/22 0320  NA 133* 136 136  139 139  K 4.6 4.0 3.9 3.8 3.8  CL 102 101 103 107 106  CO2 21* 27 26 25 26   GLUCOSE 131* 104* 107* 96 91  BUN 19 19 22 20 19   CREATININE 1.33* 1.27* 1.26* 0.98 1.00  CALCIUM 8.8* 8.6* 8.1* 8.2* 8.1*  MG  --  2.1  --   --   --   PHOS  --   --   --   --  2.2*    GFR: Estimated Creatinine Clearance: 76.1 mL/min (by C-G formula based on SCr of 1 mg/dL). Liver Function Tests: Recent Labs  Lab 02/06/22 2229 02/07/22 0640 02/08/22 0324 02/09/22 0733 02/10/22 0320  AST 184* 249*  242* 379* 382*  --   ALT 171* 212*  205* 306* 337*  --   ALKPHOS  240* 237*  252* 247* 278*  --   BILITOT 2.6* 3.1*  3.1* 3.7* 3.8*  --   PROT 6.4* 5.9*  6.1* 5.7* 5.5*  --   ALBUMIN 3.6 3.0*  3.3* 2.9* 2.6* 2.6*    Recent Labs  Lab 02/06/22 2229  LIPASE 32    No results for input(s): AMMONIA in the last 168 hours. Coagulation Profile: Recent Labs  Lab 02/08/22 0828  INR 1.0    Cardiac Enzymes: No results for input(s): CKTOTAL, CKMB, CKMBINDEX, TROPONINI in the last 168 hours. BNP (last 3 results) No results for input(s): PROBNP in the last 8760 hours. HbA1C: No results for input(s): HGBA1C in the last 72 hours. CBG: No results for input(s): GLUCAP in the last 168 hours. Lipid Profile: No results for input(s): CHOL, HDL, LDLCALC, TRIG, CHOLHDL, LDLDIRECT in the last 72 hours. Thyroid Function Tests: No results for input(s): TSH, T4TOTAL, FREET4, T3FREE, THYROIDAB in the last 72 hours. Anemia Panel: Recent Labs    02/08/22 1143  FERRITIN 1,229*  TIBC 327  IRON 32*    Sepsis Labs: No results for input(s): PROCALCITON, LATICACIDVEN in the last 168 hours.  No results found for this or any previous visit (from the past 240 hour(s)).       Radiology Studies: NM Hepatobiliary Liver Func  Result Date: 02/08/2022 CLINICAL DATA:  Nondiagnostic ultrasound, RIGHT upper quadrant pain, epigastric pain, nausea, vomiting, question cholecystitis EXAM: NUCLEAR MEDICINE HEPATOBILIARY IMAGING TECHNIQUE: Sequential images of the abdomen were obtained out to 60 minutes following intravenous administration of radiopharmaceutical. RADIOPHARMACEUTICALS:  7.5 mCi Tc-2m  Choletec IV COMPARISON:  CT abdomen and pelvis 02/07/2022, abdomen ultrasound RIGHT upper quadrant 02/05/2022 FINDINGS: Slightly delayed tracer extraction from bloodstream indicating mildly impaired hepatic function, with blood pool residual noted at 10 minutes. Delayed excretion of tracer into biliary tree. Small bowel visualized at 37 minutes. At 1 hour gallbladder had not  visualized. Additional imaging for 30 minutes demonstrates visualization of the gallbladder beginning at 65 minutes. Findings are consistent with patent cystic duct and common bile duct. IMPRESSION: Patent cystic duct and common bile duct. No scintigraphic evidence of acute cholecystitis. Electronically Signed   By: Lavonia Dana M.D.   On: 02/08/2022 15:44   ECHOCARDIOGRAM COMPLETE  Result Date: 02/09/2022    ECHOCARDIOGRAM REPORT   Patient Name:   STACEY MATEJKA Date of Exam: 02/09/2022 Medical Rec #:  [id number]  Height:       72.0 in Accession #:    [id number] Weight:       153.9 lb Date of Birth:  [birth year]   BSA:          1.906 m Patient Age:  60 years   BP:           118/75 mmHg Patient Gender: M          HR:           67 bpm. Exam Location:  Inpatient Procedure: 2D Echo, Cardiac Doppler and Color Doppler Indications:    Abnormal EKG  History:        Patient has no prior history of Echocardiogram examinations.  Sonographer:    Cleatis Polka Referring Phys: [phone] JESSICA U VANN IMPRESSIONS  1. Left ventricular ejection fraction, by estimation, is 60 to 65%. The left ventricle has normal function. The left ventricle has no regional wall motion abnormalities. There is mild left ventricular hypertrophy of the basal-septal segment. Left ventricular diastolic parameters were normal.  2. Right ventricular systolic function is normal. The right ventricular size is normal. There is normal pulmonary artery systolic pressure. The estimated right ventricular systolic pressure is 21.2 mmHg.  3. The mitral valve is normal in structure. Mild mitral valve regurgitation. No evidence of mitral stenosis.  4. The aortic valve is tricuspid. Aortic valve regurgitation is mild to moderate. No aortic stenosis is present.  5. The inferior vena cava is dilated in size with >50% respiratory variability, suggesting right atrial pressure of 8 mmHg. FINDINGS  Left Ventricle: Left ventricular ejection fraction, by estimation, is 60 to 65%. The  left ventricle has normal function. The left ventricle has no regional wall motion abnormalities. The left ventricular internal cavity size was normal in size. There is  mild left ventricular hypertrophy of the basal-septal segment. Left ventricular diastolic parameters were normal. Normal left ventricular filling pressure. Right Ventricle: The right ventricular size is normal. No increase in right ventricular wall thickness. Right ventricular systolic function is normal. There is normal pulmonary artery systolic pressure. The tricuspid regurgitant velocity is 1.82 m/s, and  with an assumed right atrial pressure of 8 mmHg, the estimated right ventricular systolic pressure is 21.2 mmHg. Left Atrium: Left atrial size was normal in size. Right Atrium: Right atrial size was normal in size. Pericardium: There is no evidence of pericardial effusion. Mitral Valve: The mitral valve is normal in structure. Mild mitral valve regurgitation, with centrally-directed jet. No evidence of mitral valve stenosis. Tricuspid Valve: The tricuspid valve is normal in structure. Tricuspid valve regurgitation is trivial. No evidence of tricuspid stenosis. Aortic Valve: The aortic valve is tricuspid. Aortic valve regurgitation is mild to moderate. No aortic stenosis is present. Aortic valve peak gradient measures 7.2 mmHg. Pulmonic Valve: The pulmonic valve was normal in structure. Pulmonic valve regurgitation is trivial. No evidence of pulmonic stenosis. Aorta: The aortic root and ascending aorta are structurally normal, with no evidence of dilitation. Venous: The inferior vena cava is dilated in size with greater than 50% respiratory variability, suggesting right atrial pressure of 8 mmHg. IAS/Shunts: No atrial level shunt detected by color flow Doppler.  LEFT VENTRICLE PLAX 2D LVIDd:         4.40 cm      Diastology LVIDs:         2.80 cm      LV e' medial:    7.94 cm/s LV PW:         1.00 cm      LV E/e' medial:  8.2 LV IVS:        1.30  cm      LV e' lateral:   7.29 cm/s LVOT diam:     2.20 cm  LV E/e' lateral: 8.9 LV SV:         86 LV SV Index:   45 LVOT Area:     3.80 cm  LV Volumes (MOD) LV vol d, MOD A2C: 119.0 ml LV vol d, MOD A4C: 120.0 ml LV vol s, MOD A2C: 42.5 ml LV vol s, MOD A4C: 43.5 ml LV SV MOD A2C:     76.5 ml LV SV MOD A4C:     120.0 ml LV SV MOD BP:      76.7 ml RIGHT VENTRICLE             IVC RV Basal diam:  3.10 cm     IVC diam: 2.60 cm RV Mid diam:    2.80 cm RV S prime:     14.50 cm/s TAPSE (M-mode): 2.7 cm LEFT ATRIUM             Index        RIGHT ATRIUM           Index LA diam:        3.60 cm 1.89 cm/m   RA Area:     13.90 cm LA Vol (A2C):   70.9 ml 37.20 ml/m  RA Volume:   32.50 ml  17.05 ml/m LA Vol (A4C):   60.8 ml 31.90 ml/m LA Biplane Vol: 67.0 ml 35.15 ml/m  AORTIC VALVE                 PULMONIC VALVE AV Area (Vmax): 3.01 cm     PR End Diast Vel: 1.63 msec AV Vmax:        134.00 cm/s AV Peak Grad:   7.2 mmHg LVOT Vmax:      106.00 cm/s LVOT Vmean:     70.000 cm/s LVOT VTI:       0.226 m  AORTA Ao Root diam: 3.40 cm Ao Asc diam:  3.00 cm MITRAL VALVE               TRICUSPID VALVE MV Area (PHT): 4.15 cm    TR Peak grad:   13.2 mmHg MV Decel Time: 183 msec    TR Vmax:        182.00 cm/s MR Peak grad: 92.2 mmHg MR Vmax:      480.00 cm/s  SHUNTS MV E velocity: 65.10 cm/s  Systemic VTI:  0.23 m MV A velocity: 36.80 cm/s  Systemic Diam: 2.20 cm MV E/A ratio:  1.77 Mihai Croitoru MD Electronically signed by Sanda Klein MD Signature Date/Time: 02/09/2022/1:28:04 PM    Final         Scheduled Meds:  diltiazem  60 mg Oral BID   heparin  5,000 Units Subcutaneous Q8H   metoprolol tartrate  12.5 mg Oral BID   pantoprazole  40 mg Oral Daily   Continuous Infusions:  sodium chloride 125 mL/hr at 02/10/22 0028   piperacillin-tazobactam (ZOSYN)  IV 3.375 g (02/10/22 0007)     LOS: 2 days    Bonnell Public, MD [facility] Available via Epic secure chat 7am-7pm After these hours, please refer  to coverage provider listed on [email or web address] 02/10/2022, 10:18 AM

## 2022-02-11 ENCOUNTER — Encounter (HOSPITAL_COMMUNITY): Payer: Self-pay | Admitting: Gastroenterology

## 2022-02-11 DIAGNOSIS — R7989 Other specified abnormal findings of blood chemistry: Secondary | ICD-10-CM | POA: Diagnosis not present

## 2022-02-11 LAB — ANTI-SMOOTH MUSCLE ANTIBODY, IGG: F-Actin IgG: 17 Units (ref 0–19)

## 2022-02-11 LAB — COMPREHENSIVE METABOLIC PANEL
ALT: 313 U/L — ABNORMAL HIGH (ref 0–44)
AST: 249 U/L — ABNORMAL HIGH (ref 15–41)
Albumin: 2.6 g/dL — ABNORMAL LOW (ref 3.5–5.0)
Alkaline Phosphatase: 318 U/L — ABNORMAL HIGH (ref 38–126)
Anion gap: 6 (ref 5–15)
BUN: 18 mg/dL (ref 8–23)
CO2: 27 mmol/L (ref 22–32)
Calcium: 8.1 mg/dL — ABNORMAL LOW (ref 8.9–10.3)
Chloride: 106 mmol/L (ref 98–111)
Creatinine, Ser: 0.88 mg/dL (ref 0.61–1.24)
GFR, Estimated: >60 mL/min (ref >60)
Glucose, Bld: 97 mg/dL (ref 70–99)
Potassium: 3.6 mmol/L (ref 3.5–5.1)
Sodium: 139 mmol/L (ref 135–145)
Total Bilirubin: 2.2 mg/dL — ABNORMAL HIGH (ref 0.3–1.2)
Total Protein: 5.3 g/dL — ABNORMAL LOW (ref 6.5–8.1)

## 2022-02-11 LAB — ROCKY MTN SPOTTED FVR ABS PNL(IGG+IGM)
RMSF IgG: NEGATIVE
RMSF IgM: 0.43 index (ref 0.00–0.89)

## 2022-02-11 LAB — CMV DNA, QUANTITATIVE, PCR
CMV DNA Quant: NEGATIVE IU/mL
Log10 CMV Qn DNA Pl: UNDETERMINED log10 IU/mL

## 2022-02-11 LAB — EPSTEIN BARR VRS(EBV DNA BY PCR): EBV DNA QN by PCR: NEGATIVE IU/mL

## 2022-02-11 LAB — LYME DISEASE SEROLOGY W/REFLEX: Lyme Total Antibody EIA: NEGATIVE

## 2022-02-11 MED ORDER — PANTOPRAZOLE SODIUM 40 MG PO TBEC: 40.0000 mg | DELAYED_RELEASE_TABLET | Freq: Every day | ORAL | 1 refills | Status: DC

## 2022-02-11 NOTE — Discharge Instructions (Signed)
take pump inhibitor  (protonix) and avoid aspirin and nonsteroidals and minimize Tylenol

## 2022-02-11 NOTE — Discharge Summary (Signed)
Physician Discharge Summary  Louis Leon WUJ:[id number] DOB: [birth year] DOA: 02/06/2022  PCP: Elenora Gamma, MD (Inactive)  Admit date: 02/06/2022 Discharge date: 02/11/2022  Admitted From: home Discharge disposition: home   Recommendations for Outpatient Follow-Up:   Labs pending: infectious mononucleosis/EBV, HSV, CMV, RMSF  LFTs 2 weeks GI follow up: PPI x 1 month   Discharge Diagnosis:   Principal Problem:   Elevated LFTs Active Problems:   AKI (acute kidney injury) (HCC)   Abdominal pain    Discharge Condition: Improved.  Diet recommendation: Low sodium, heart healthy.  Wound care: None.  Code status: Full.   History of Present Illness:   Louis Leon is a 61 y.o. male with medical history significant of SVT.  He went to [facility] on 5/30 with abdominal pain, HA.  Meningitis was suspected so he had a lumbar puncture and CT Scan of head due to headache.  There were also equivocal findings for cholecystitis versus inflammatory process from hepatitis General surgery was consulted who states it would be best for this patient to follow-up in clinic as it does not appear to be acute cholecystitis.  The patient was treated with Fioricet, Benadryl, Compazine and fluids with some improvement in HA.  He presented to [facility] on 5/31 with worsening nausea and abdominal pain after eating some [employer].     Hospital Course by Problem:   Elevated LFTs/abdominal pain/nausea -trend labs- trending up currently -CT Scan: Gallbladder is contracted, but there appears to be gallbladder wall thickening -GS consult appreciated: HIDA scan negative for cystic duct obstruction/cholecystitis.  Working ongoing for cause of elevated liver function tests.  Given MRCP and HIDA, gallbladder does not seem to be the cause.  No plans for surgery at this point. -viral hepatitis panel negative -recently removed 10 ticks: on < 24 hours, no rash: r/o RMSF- pending, lyme negative -denies  tylenol use -echo done and EF preserved -GI consult: does drink some alcohol at home does not remember if he has ever had liver test before and does not regularly see anyone but his cardiologist periodically and does use some Tums and Pepto-Bismol at home and had been on some aspirin and ibuprofen which we discussed using up to 4 Tylenol a day only because of his ulcer and we also talked about his pump inhibitor at home and he is nontender on exam his liver tests are slightly decreased from yesterday and I think it is okay to go home with close outpatient follow-up with my partner Dr. Marca Ancona and he will take his pump inhibitor and avoid aspirin and nonsteroidals and minimize Tylenol    AKI -Resolved.    Medical Consultants:   GI GS   Discharge Exam:   Vitals:   02/11/22 0538 02/11/22 0854  BP: 135/71 124/78  Pulse: (!) 55 68  Resp: 14 14  Temp: 98.1 F (36.7 C) 98.7 F (37.1 C)  SpO2: 97% 97%   Vitals:   02/10/22 2135 02/11/22 0500 02/11/22 0538 02/11/22 0854  BP: 131/70  135/71 124/78  Pulse: 64  (!) 55 68  Resp: Temp: 98.3 F (36.8 C)  98.1 F (36.7 C) 98.7 F (37.1 C)  TempSrc: Oral  Oral Oral  SpO2: 96%  97% 97%  Weight:  71.4 kg    Height:        General exam: Appears calm and comfortable.   The results of significant diagnostics from this hospitalization (including imaging, microbiology, ancillary and laboratory)  are listed below for reference.     Procedures and Diagnostic Studies:   CT ABDOMEN PELVIS W CONTRAST  Result Date: 02/07/2022 CLINICAL DATA:  Abdominal pain, acute, nonlocalized EXAM: CT ABDOMEN AND PELVIS WITH CONTRAST TECHNIQUE: Multidetector CT imaging of the abdomen and pelvis was performed using the standard protocol following bolus administration of intravenous contrast. RADIATION DOSE REDUCTION: This exam was performed according to the departmental dose-optimization program which includes automated exposure control, adjustment of the  mA and/or kV according to patient size and/or use of iterative reconstruction technique. CONTRAST:  OMNIPAQUE IOHEXOL 300 MG/ML  SOLN COMPARISON:  None Available. FINDINGS: Lower chest: No acute abnormality.  Dependent bibasilar atelectasis. Hepatobiliary: Gallbladder is contracted with apparent wall thickening. No focal hepatic abnormality. No biliary ductal dilatation. Pancreas: No focal abnormality or ductal dilatation. Spleen: No focal abnormality.  Normal size. Adrenals/Urinary Tract: Left renal parapelvic cysts. No hydronephrosis. Adrenal glands and urinary bladder unremarkable. Stomach/Bowel: Sigmoid diverticulosis. No active diverticulitis. Stomach and small bowel decompressed, unremarkable. Vascular/Lymphatic: No evidence of aneurysm or adenopathy. Aortic atherosclerosis. Reproductive: No visible focal abnormality. Other: Small amount of free fluid in the pelvis.  No free air. Musculoskeletal: No acute bony abnormality. IMPRESSION: Gallbladder is contracted, but there appears to be gallbladder wall thickening. If there is clinical concern for cholecystitis, this could be further evaluated with ultrasound. Small amount of free fluid in the pelvis of unknown etiology. Sigmoid diverticulosis. Electronically Signed   By: Charlett Nose M.D.   On: 02/07/2022 01:10   MR 3D Recon At Scanner  Result Date: 02/07/2022 CLINICAL DATA:  A 62 year old male presents for evaluation of RIGHT upper quadrant pain and elevated liver function tests. EXAM: MRI ABDOMEN WITHOUT AND WITH CONTRAST (INCLUDING MRCP) TECHNIQUE: Multiplanar multisequence MR imaging of the abdomen was performed both before and after the administration of intravenous contrast. Heavily T2-weighted images of the biliary and pancreatic ducts were obtained, and three-dimensional MRCP images were rendered by post processing. CONTRAST:  15mL GADAVIST GADOBUTROL 1 MMOL/ML IV SOLN COMPARISON:  Abdominal sonogram of Feb 05, 2022. CT of the abdomen and pelvis  of February 07, 2022. FINDINGS: Lower chest: No effusion. Basilar airspace disease likely atelectasis. Hepatobiliary: Gallbladder wall thickening is pronounced without substantial distension of the lumen or evidence of cholelithiasis. Lumen is only minimally distended over the exam of earlier the same date and remain surrounded by edema in the wall. No biliary duct dilation. Diminutive appearance of the common bile duct. No focal, suspicious hepatic lesion. Pancreas: Normal intrinsic T1 signal. No ductal dilation or sign of inflammation. No focal lesion. Spleen:  Normal. Adrenals/Urinary Tract:  Adrenal glands are normal. Symmetric renal enhancement without hydronephrosis or suspicious renal lesion. Stomach/Bowel: Unremarkable to the extent evaluated on abdominal MRI. Vascular/Lymphatic: No pathologically enlarged lymph nodes identified. No abdominal aortic aneurysm demonstrated. Portal vein is patent. Other: No ascites in the abdomen. Ascites noted in the pelvis on previous CT. Musculoskeletal: No suspicious bone lesions identified. IMPRESSION: Gallbladder wall thickening and edema without substantial gallbladder distension, biliary duct dilation or cholelithiasis. Findings favor reactive changes from ongoing hepatitis or hepatic dysfunction. Findings could also be seen in the setting of cardiac dysfunction. If there are worsening of symptoms or further concern for acute biliary process, HIDA scan could add specificity as warranted. Electronically Signed   By: Donzetta Kohut M.D.   On: 02/07/2022 21:01   MR ABDOMEN MRCP W WO CONTAST  Result Date: 02/07/2022 CLINICAL DATA:  A 61 year old male presents for evaluation of RIGHT upper  quadrant pain and elevated liver function tests. EXAM: MRI ABDOMEN WITHOUT AND WITH CONTRAST (INCLUDING MRCP) TECHNIQUE: Multiplanar multisequence MR imaging of the abdomen was performed both before and after the administration of intravenous contrast. Heavily T2-weighted images of the  biliary and pancreatic ducts were obtained, and three-dimensional MRCP images were rendered by post processing. CONTRAST:  3mL GADAVIST GADOBUTROL 1 MMOL/ML IV SOLN COMPARISON:  Abdominal sonogram of Feb 05, 2022. CT of the abdomen and pelvis of February 07, 2022. FINDINGS: Lower chest: No effusion. Basilar airspace disease likely atelectasis. Hepatobiliary: Gallbladder wall thickening is pronounced without substantial distension of the lumen or evidence of cholelithiasis. Lumen is only minimally distended over the exam of earlier the same date and remain surrounded by edema in the wall. No biliary duct dilation. Diminutive appearance of the common bile duct. No focal, suspicious hepatic lesion. Pancreas: Normal intrinsic T1 signal. No ductal dilation or sign of inflammation. No focal lesion. Spleen:  Normal. Adrenals/Urinary Tract:  Adrenal glands are normal. Symmetric renal enhancement without hydronephrosis or suspicious renal lesion. Stomach/Bowel: Unremarkable to the extent evaluated on abdominal MRI. Vascular/Lymphatic: No pathologically enlarged lymph nodes identified. No abdominal aortic aneurysm demonstrated. Portal vein is patent. Other: No ascites in the abdomen. Ascites noted in the pelvis on previous CT. Musculoskeletal: No suspicious bone lesions identified. IMPRESSION: Gallbladder wall thickening and edema without substantial gallbladder distension, biliary duct dilation or cholelithiasis. Findings favor reactive changes from ongoing hepatitis or hepatic dysfunction. Findings could also be seen in the setting of cardiac dysfunction. If there are worsening of symptoms or further concern for acute biliary process, HIDA scan could add specificity as warranted. Electronically Signed   By: Donzetta Kohut M.D.   On: 02/07/2022 21:01     Labs:   Basic Metabolic Panel: Recent Labs  Lab 02/07/22 0640 02/08/22 0324 02/09/22 0733 02/10/22 0320 02/11/22 0327  NA 136 136 139 139 139  K 4.0 3.9 3.8 3.8  3.6  CL 101 103 107 106 106  CO2 27 26 25 26 27   GLUCOSE 104* 107* 96 91 97  BUN 19 22 20 19 18   CREATININE 1.27* 1.26* 0.98 1.00 0.88  CALCIUM 8.6* 8.1* 8.2* 8.1* 8.1*  MG 2.1  --   --   --   --   PHOS  --   --   --  2.2*  --    GFR Estimated Creatinine Clearance: 87.9 mL/min (by C-G formula based on SCr of 0.88 mg/dL). Liver Function Tests: Recent Labs  Lab 02/06/22 2229 02/07/22 0640 02/08/22 0324 02/09/22 0733 02/10/22 0320 02/11/22 0327  AST 184* 249*  242* 379* 382*  --  249*  ALT 171* 212*  205* 306* 337*  --  313*  ALKPHOS 240* 237*  252* 247* 278*  --  318*  BILITOT 2.6* 3.1*  3.1* 3.7* 3.8*  --  2.2*  PROT 6.4* 5.9*  6.1* 5.7* 5.5*  --  5.3*  ALBUMIN 3.6 3.0*  3.3* 2.9* 2.6* 2.6* 2.6*   Recent Labs  Lab 02/06/22 2229  LIPASE 32   No results for input(s): AMMONIA in the last 168 hours. Coagulation profile Recent Labs  Lab 02/08/22 0828  INR 1.0    CBC: Recent Labs  Lab 02/06/22 2229 02/07/22 0640 02/07/22 0832 02/08/22 0324  WBC 1.9* 2.0* 2.0* 5.4  NEUTROABS  --  0.9*  --   --   HGB 13.9 13.7 13.7 13.3  HCT 41.9 41.2 40.8 39.9  MCV 90.5 92.4 91.9 91.3  PLT  129* 121* 120* 118*   Cardiac Enzymes: No results for input(s): CKTOTAL, CKMB, CKMBINDEX, TROPONINI in the last 168 hours. BNP: Invalid input(s): POCBNP CBG: No results for input(s): GLUCAP in the last 168 hours. D-Dimer No results for input(s): DDIMER in the last 72 hours. Hgb A1c No results for input(s): HGBA1C in the last 72 hours. Lipid Profile No results for input(s): CHOL, HDL, LDLCALC, TRIG, CHOLHDL, LDLDIRECT in the last 72 hours. Thyroid function studies No results for input(s): TSH, T4TOTAL, T3FREE, THYROIDAB in the last 72 hours.  Invalid input(s): FREET3 Anemia work up No results for input(s): VITAMINB12, FOLATE, FERRITIN, TIBC, IRON, RETICCTPCT in the last 72 hours. Microbiology No results found for this or any previous visit (from the past 240  hour(s)).   Discharge Instructions:   Discharge Instructions     Diet general   Complete by: As directed    Increase activity slowly   Complete by: As directed       Allergies as of 02/11/2022   No Known Allergies      Medication List     STOP taking these medications    ibuprofen 200 MG tablet Commonly known as: ADVIL       TAKE these medications    acetaminophen 500 MG tablet Commonly known as: TYLENOL Take 1,000 mg by mouth every 6 (six) hours as needed for mild pain.   diltiazem 60 MG tablet Commonly known as: CARDIZEM Take 60 mg by mouth 2 (two) times daily.   metoprolol tartrate 25 MG tablet Commonly known as: LOPRESSOR Take 25 mg by mouth 2 (two) times daily.   ondansetron 4 MG disintegrating tablet Commonly known as: ZOFRAN-ODT Take 4 mg by mouth every 8 (eight) hours as needed for nausea/vomiting.   pantoprazole 40 MG tablet Commonly known as: PROTONIX Take 1 tablet (40 mg total) by mouth daily. Start taking on: February 12, 2022        Follow-up Information     Kerin SalenKarki, Arya, MD. Schedule an appointment as soon as possible for a visit.   Specialty: Gastroenterology Why: follow up of liver enzymes and pending labs from hospital Contact information: [address] [place] [address] [phone]                  Time coordinating discharge: 35 min  Signed:  Joseph ArtJessica U Malanie Koloski DO  [facility] 02/11/2022, 1:09 PM

## 2022-02-11 NOTE — Plan of Care (Signed)

## 2022-02-11 NOTE — Progress Notes (Signed)
[facility] Gastroenterology Progress Note  Louis Leon 61 y.o. Jul 02, 1961  CC:  Elevated LFTs   Subjective: Patient doing well this morning, states he is ready to go home.  Denies nausea, vomiting, fever, chills, abdominal pain, heartburn.  Tolerating regular diet and ambulating without difficulty.  Reports normal bowel movement this morning, denies melena or hematochezia.  ROS : Review of Systems  Constitutional:  Negative for chills and fever.  Gastrointestinal:  Negative for abdominal pain, blood in stool, constipation, diarrhea, heartburn, melena, nausea and vomiting.  Genitourinary:  Negative for dysuria and urgency.  Musculoskeletal:  Negative for falls.     Objective: Vital signs in last 24 hours: Vitals:   02/11/22 0538 02/11/22 0854  BP: 135/71 124/78  Pulse: (!) 55 68  Resp: 14 14  Temp: 98.1 F (36.7 C) 98.7 F (37.1 C)  SpO2: 97% 97%    Physical Exam:  General:  Alert, cooperative, no distress, appears stated age  Head:  Normocephalic, without obvious abnormality, atraumatic  Eyes:  Anicteric sclera, EOM's intact  Lungs:   Clear to auscultation bilaterally, respirations unlabored  Heart:  Regular rate and rhythm, S1, S2 normal  Abdomen:   Soft, non-tender, bowel sounds active all four quadrants,  no masses,   Extremities: Extremities normal, atraumatic, no  edema  Pulses: 2+ and symmetric    Lab Results: Recent Labs    02/10/22 0320 02/11/22 0327  NA 139 139  K 3.8 3.6  CL 106 106  CO2 26 27  GLUCOSE 91 97  BUN 19 18  CREATININE 1.00 0.88  CALCIUM 8.1* 8.1*  PHOS 2.2*  --    Recent Labs    02/09/22 0733 02/10/22 0320 02/11/22 0327  AST 382*  --  249*  ALT 337*  --  313*  ALKPHOS 278*  --  318*  BILITOT 3.8*  --  2.2*  PROT 5.5*  --  5.3*  ALBUMIN 2.6* 2.6* 2.6*   No results for input(s): WBC, NEUTROABS, HGB, HCT, MCV, PLT in the last 72 hours. No results for input(s): LABPROT, INR in the last 72 hours.    Assessment Elevated LFTs - AST  249, ALT 313, Alk Phos 318, Albumin 2.6, Tbili 2.2 (improved)  - CT showed contracted gallbladder, no focal liver abnormality, no biliary dilation.  -MRI/MRCP showed gallbladder wall thickening and edema, possible ongoing hepatitis or hepatic dysfunction vs cardiac dysfunction.  -HIDA scan showed patent cystic and common bile duct, no evidence of acute cholecystitis.  -Work-up pending for infectious mononucleosis/EBV, HSV, CMV.  -EGD 02/10/2022 showed friable mucosa in the esophagus with contact bleeding.  Normal stomach.  Nonbleeding duodenal ulcer with a clean ulcer base.  Biopsies pending.  Plan: Okay for discharge from GI standpoint.  Needs outpatient follow-up with Dr. Therisa Doyne. Continue Protonix 54m PO for 1 month.  SAngelique HolmPA-C 02/11/2022, 12:44 PM  Contact #  [phone]

## 2022-02-11 NOTE — Progress Notes (Signed)
Provided discharge education/instructions, all questions and concerns addressed. Pt not in acute distress, discharged home with belongings accompanied by family. 

## 2022-02-12 LAB — SURGICAL PATHOLOGY

## 2022-02-12 LAB — MITOCHONDRIAL ANTIBODIES: Mitochondrial M2 Ab, IgG: <20 Units (ref 0.0–20.0)

## 2022-02-12 LAB — LYME DISEASE SEROLOGY W/REFLEX: Lyme Total Antibody EIA: NEGATIVE

## 2022-03-07 DIAGNOSIS — R945 Abnormal results of liver function studies: Secondary | ICD-10-CM | POA: Diagnosis not present

## 2022-03-13 DIAGNOSIS — R945 Abnormal results of liver function studies: Secondary | ICD-10-CM | POA: Diagnosis not present

## 2022-04-15 DIAGNOSIS — E7849 Other hyperlipidemia: Secondary | ICD-10-CM | POA: Diagnosis not present

## 2022-04-15 DIAGNOSIS — R0602 Shortness of breath: Secondary | ICD-10-CM | POA: Diagnosis not present

## 2022-04-15 DIAGNOSIS — R072 Precordial pain: Secondary | ICD-10-CM | POA: Diagnosis not present

## 2022-04-15 DIAGNOSIS — I471 Supraventricular tachycardia: Secondary | ICD-10-CM | POA: Diagnosis not present

## 2022-05-03 DIAGNOSIS — Z79899 Other long term (current) drug therapy: Secondary | ICD-10-CM | POA: Diagnosis not present

## 2022-05-03 DIAGNOSIS — E7849 Other hyperlipidemia: Secondary | ICD-10-CM | POA: Diagnosis not present

## 2022-05-03 DIAGNOSIS — D649 Anemia, unspecified: Secondary | ICD-10-CM | POA: Diagnosis not present

## 2022-05-03 DIAGNOSIS — N429 Disorder of prostate, unspecified: Secondary | ICD-10-CM | POA: Diagnosis not present

## 2022-05-03 DIAGNOSIS — E876 Hypokalemia: Secondary | ICD-10-CM | POA: Diagnosis not present

## 2022-05-20 DIAGNOSIS — E7849 Other hyperlipidemia: Secondary | ICD-10-CM | POA: Diagnosis not present

## 2022-05-20 DIAGNOSIS — R0602 Shortness of breath: Secondary | ICD-10-CM | POA: Diagnosis not present

## 2022-05-20 DIAGNOSIS — R072 Precordial pain: Secondary | ICD-10-CM | POA: Diagnosis not present

## 2022-05-20 DIAGNOSIS — R001 Bradycardia, unspecified: Secondary | ICD-10-CM | POA: Diagnosis not present

## 2022-06-24 DIAGNOSIS — L218 Other seborrheic dermatitis: Secondary | ICD-10-CM | POA: Diagnosis not present

## 2022-06-24 DIAGNOSIS — L718 Other rosacea: Secondary | ICD-10-CM | POA: Diagnosis not present

## 2022-06-24 DIAGNOSIS — L820 Inflamed seborrheic keratosis: Secondary | ICD-10-CM | POA: Diagnosis not present

## 2022-08-12 DIAGNOSIS — E1165 Type 2 diabetes mellitus with hyperglycemia: Secondary | ICD-10-CM | POA: Diagnosis not present

## 2022-08-12 DIAGNOSIS — E7849 Other hyperlipidemia: Secondary | ICD-10-CM | POA: Diagnosis not present

## 2022-08-12 DIAGNOSIS — E876 Hypokalemia: Secondary | ICD-10-CM | POA: Diagnosis not present

## 2022-08-12 DIAGNOSIS — Z79899 Other long term (current) drug therapy: Secondary | ICD-10-CM | POA: Diagnosis not present

## 2022-08-12 DIAGNOSIS — D649 Anemia, unspecified: Secondary | ICD-10-CM | POA: Diagnosis not present

## 2022-11-27 ENCOUNTER — Ambulatory Visit (INDEPENDENT_AMBULATORY_CARE_PROVIDER_SITE_OTHER): Payer: BC Managed Care – PPO | Admitting: Family Medicine

## 2022-11-27 ENCOUNTER — Encounter: Payer: Self-pay | Admitting: Family Medicine

## 2022-11-27 VITALS — BP 89/45 | HR 62 | Temp 98.4°F | Ht 72.0 in | Wt 156.5 lb

## 2022-11-27 DIAGNOSIS — J358 Other chronic diseases of tonsils and adenoids: Secondary | ICD-10-CM | POA: Diagnosis not present

## 2022-11-27 DIAGNOSIS — R490 Dysphonia: Secondary | ICD-10-CM

## 2022-11-27 DIAGNOSIS — R7303 Prediabetes: Secondary | ICD-10-CM

## 2022-11-27 DIAGNOSIS — J029 Acute pharyngitis, unspecified: Secondary | ICD-10-CM | POA: Diagnosis not present

## 2022-11-27 DIAGNOSIS — I471 Supraventricular tachycardia, unspecified: Secondary | ICD-10-CM

## 2022-11-27 NOTE — Progress Notes (Signed)
New Patient Office Visit  Subjective    Patient ID: Louis Leon, male    DOB: May 06, 1961  Age: 62 y.o. MRN: [id number]  CC:  Chief Complaint  Patient presents with   Sore Throat         HPI Louis Leon presents to establish care.  He is established with cardiology for SVT. [facility] Dr. Doylene Canard in [place]. He is on Cardizem and metoprolol. He denies palpitations, chest pain, or shortness of breath. He reports that his BP is generally low. He denies dizziness or lightheadedness. Denies syncope. He last saw cardiology in December and had labs done at that visit.    He reports a scratchy throat for 6 weeks with redness. This has been daily and waxes and wanes throughout the day. He does clear his throat frequently. Denies trouble swallowing. He denies nasal congestion, runny nose, cough, or postanal drip. He reports that frequent talking irritates his throat. He has hoarseness and changes in his voice frequently that lasts throughout the day. He denies heartburn, abdominal pain. Denies fever, vomiting, ear pain. Denies watery or itchy eyes. He has gargled salt water without improvement.     Outpatient Encounter Medications as of 11/27/2022  Medication Sig   diltiazem (CARDIZEM) 60 MG tablet Take 60 mg by mouth 2 (two) times daily.   metoprolol tartrate (LOPRESSOR) 25 MG tablet Take 25 mg by mouth 2 (two) times daily.   [DISCONTINUED] acetaminophen (TYLENOL) 500 MG tablet Take 1,000 mg by mouth every 6 (six) hours as needed for mild pain.   [DISCONTINUED] ondansetron (ZOFRAN-ODT) 4 MG disintegrating tablet Take 4 mg by mouth every 8 (eight) hours as needed for nausea/vomiting.   [DISCONTINUED] pantoprazole (PROTONIX) 40 MG tablet Take 1 tablet (40 mg total) by mouth daily.   No facility-administered encounter medications on file as of 11/27/2022.    Past Medical History:  Diagnosis Date   SVT (supraventricular tachycardia)    Tachycardia     Past Surgical History:  Procedure  Laterality Date   BIOPSY  02/10/2022   Procedure: BIOPSY;  Surgeon: Ronnette Juniper, MD;  Location: [facility];  Service: Gastroenterology;;   ESOPHAGOGASTRODUODENOSCOPY (EGD) WITH PROPOFOL N/A 02/10/2022   Procedure: ESOPHAGOGASTRODUODENOSCOPY (EGD) WITH PROPOFOL;  Surgeon: Ronnette Juniper, MD;  Location: [facility];  Service: Gastroenterology;  Laterality: N/A;   VASECTOMY      Family History  Problem Relation Age of Onset   Stroke Mother    Dementia Mother    COPD Father     Social History   Socioeconomic History   Marital status: Single    Spouse name: Not on file   Number of children: Not on file   Years of education: Not on file   Highest education level: Not on file  Occupational History   Not on file  Tobacco Use   Smoking status: Never   Smokeless tobacco: Not on file  Substance and Sexual Activity   Alcohol use: Not Currently   Drug use: Not Currently   Sexual activity: Not on file  Other Topics Concern   Not on file  Social History Narrative   Not on file   Social Determinants of Health   Financial Resource Strain: Not on file  Food Insecurity: Not on file  Transportation Needs: Not on file  Physical Activity: Not on file  Stress: Not on file  Social Connections: Not on file  Intimate Partner Violence: Not on file    ROS As per HPI.  Objective    BP (!) 89/45   Pulse 62   Temp 98.4 F (36.9 C) (Temporal)   Ht 6' (1.829 m)   Wt 156 lb 8 oz (71 kg)   SpO2 94%   BMI 21.23 kg/m   Physical Exam Vitals and nursing note reviewed.  Constitutional:      General: He is not in acute distress.    Appearance: Normal appearance. He is not ill-appearing, toxic-appearing or diaphoretic.  HENT:     Head: Normocephalic and atraumatic.     Right Ear: Tympanic membrane, ear canal and external ear normal.     Left Ear: Tympanic membrane, ear canal and external ear normal.     Nose: Nose normal.     Mouth/Throat:     Mouth: Mucous membranes are moist. No  oral lesions.     Tongue: No lesions. Tongue does not deviate from midline.     Palate: No mass.     Pharynx: Uvula midline. Posterior oropharyngeal erythema (mild) present. No pharyngeal swelling, oropharyngeal exudate or uvula swelling.     Tonsils: No tonsillar exudate or tonsillar abscesses. 0 on the right. 0 on the left.     Comments: Small tonsil stones on left.  Eyes:     General: No scleral icterus.       Right eye: No discharge.        Left eye: No discharge.     Conjunctiva/sclera: Conjunctivae normal.  Cardiovascular:     Rate and Rhythm: Normal rate and regular rhythm.     Heart sounds: Normal heart sounds. No murmur heard. Pulmonary:     Effort: Pulmonary effort is normal. No respiratory distress.     Breath sounds: Normal breath sounds.  Musculoskeletal:     Cervical back: Neck supple. No rigidity or tenderness.     Right lower leg: No edema.     Left lower leg: No edema.  Lymphadenopathy:     Cervical: No cervical adenopathy.  Skin:    General: Skin is warm and dry.  Neurological:     General: No focal deficit present.     Mental Status: He is alert and oriented to person, place, and time.  Psychiatric:        Mood and Affect: Mood normal.        Behavior: Behavior normal.        Thought Content: Thought content normal.        Judgment: Judgment normal.         Assessment & Plan:   Louis Leon was seen today for sore throat.  Diagnoses and all orders for this visit:  Hoarseness Sore throat For 6 weeks. Denies GERD or allergy symptoms. Discussed that frequent throat clearing could cause symptoms. Referral to ENT for further evaluation and management.  -     Ambulatory referral to ENT  Tonsil stone Multiple, small stones on left. Discussed hygiene, gargles, water pick.   SVT (supraventricular tachycardia) Managed by cardiology. Normal rate and rhythm today. Reviewed labs from [facility] from 08/12/22. Normal hepatic panel, BMP, CBC, and lipid panel.    Prediabetes A1c was 5.9 on labs from 08/12/22. Diet and exercise. Will recheck at next appt.    Return in about 6 months (around 05/30/2023) for CPE. Sooner for new or worsening symptoms.    Gwenlyn Perking, FNP

## 2022-12-16 ENCOUNTER — Encounter: Payer: Self-pay | Admitting: Family Medicine

## 2022-12-16 ENCOUNTER — Ambulatory Visit (INDEPENDENT_AMBULATORY_CARE_PROVIDER_SITE_OTHER): Payer: BC Managed Care – PPO | Admitting: Family Medicine

## 2022-12-16 VITALS — BP 99/61 | HR 65 | Ht 72.0 in | Wt 156.0 lb

## 2022-12-16 DIAGNOSIS — H1013 Acute atopic conjunctivitis, bilateral: Secondary | ICD-10-CM | POA: Diagnosis not present

## 2022-12-16 DIAGNOSIS — J309 Allergic rhinitis, unspecified: Secondary | ICD-10-CM

## 2022-12-16 MED ORDER — HYDROXYZINE HCL 25 MG PO TABS: 25.0000 mg | ORAL_TABLET | Freq: Three times a day (TID) | ORAL | 1 refills | Status: AC | PRN

## 2022-12-16 NOTE — Progress Notes (Signed)
BP 99/61   Pulse 65   Ht 6' (1.829 m)   Wt 156 lb (70.8 kg)   SpO2 99%   BMI 21.16 kg/m    Subjective:   Patient ID: Louis Leon, male    DOB: 05-30-1961, 62 y.o.   MRN: [id number]  HPI: Louis Leon is a 62 y.o. male presenting on 12/16/2022 for Eye Problem (Itching- requesting refill of hydroxyzine)   HPI Allergies Patient comes in complaining of allergies, he says the majority of his symptoms are in his eyes that are itchy and watering and red.  He says he has not had it all of his life but over the past few years he gets it and happens about this time a year and tries multiple over-the-counter stuff including some Pataday drops and other antihistamines but ultimately what he is got a prescription for in the past is hydroxyzine and that worked the best for him.  He says it is a little sedating so he often cuts it but it has worked the best for him.  He would like to get a prescription for that.  He says it started up about a week ago and he started trying to do the Pataday and other over-the-counter stuff and it just has not improved and he would like to get some hydroxyzine again.  He says he had the hydroxyzine 50s before and that was too strong so we will try the lower dose of the 25's.  Relevant past medical, surgical, family and social history reviewed and updated as indicated. Interim medical history since our last visit reviewed. Allergies and medications reviewed and updated.  Review of Systems  Constitutional:  Negative for chills and fever.  HENT:  Positive for congestion. Negative for facial swelling, sinus pressure, sneezing and sore throat.   Eyes:  Positive for redness and itching. Negative for visual disturbance.  Respiratory:  Negative for cough, shortness of breath and wheezing.   Cardiovascular:  Negative for chest pain and leg swelling.  Musculoskeletal:  Negative for back pain and gait problem.  Skin:  Negative for rash.  All other systems reviewed and are  negative.   Per HPI unless specifically indicated above   Allergies as of 12/16/2022   No Known Allergies      Medication List        Accurate as of December 16, 2022  8:42 AM. If you have any questions, ask your nurse or doctor.          diltiazem 60 MG tablet Commonly known as: CARDIZEM Take 60 mg by mouth 2 (two) times daily.   hydrOXYzine 25 MG tablet Commonly known as: ATARAX Take 1 tablet (25 mg total) by mouth 3 (three) times daily as needed. Started by: Nils Pyle, MD   metoprolol tartrate 25 MG tablet Commonly known as: LOPRESSOR Take 25 mg by mouth 2 (two) times daily.         Objective:   BP 99/61   Pulse 65   Ht 6' (1.829 m)   Wt 156 lb (70.8 kg)   SpO2 99%   BMI 21.16 kg/m   Wt Readings from Last 3 Encounters:  12/16/22 156 lb (70.8 kg)  11/27/22 156 lb 8 oz (71 kg)  02/11/22 157 lb 6.5 oz (71.4 kg)    Physical Exam Vitals and nursing note reviewed.  Constitutional:      General: He is not in acute distress.    Appearance: He is well-developed. He is  not diaphoretic.  HENT:     Mouth/Throat:     Mouth: Mucous membranes are moist.     Pharynx: Oropharynx is clear. No oropharyngeal exudate.  Eyes:     General: No scleral icterus.    Extraocular Movements: Extraocular movements intact.     Conjunctiva/sclera:     Right eye: Right conjunctiva is injected. No exudate or hemorrhage.    Left eye: Left conjunctiva is injected. No exudate or hemorrhage. Neck:     Thyroid: No thyromegaly.  Cardiovascular:     Rate and Rhythm: Normal rate and regular rhythm.     Heart sounds: Normal heart sounds. No murmur heard. Pulmonary:     Effort: Pulmonary effort is normal. No respiratory distress.     Breath sounds: Normal breath sounds. No wheezing.  Musculoskeletal:        General: Normal range of motion.     Cervical back: Neck supple.  Lymphadenopathy:     Cervical: No cervical adenopathy.  Skin:    General: Skin is warm and dry.      Findings: No rash.  Neurological:     Mental Status: He is alert and oriented to person, place, and time.     Coordination: Coordination normal.  Psychiatric:        Behavior: Behavior normal.       Assessment & Plan:   Problem List Items Addressed This Visit   None Visit Diagnoses     Allergic conjunctivitis and rhinitis, bilateral    -  Primary   Relevant Medications   hydrOXYzine (ATARAX) 25 MG tablet     Will give prescription for hydroxyzine recommended that he could also trial Claritin to go with it and then he could also use the Pataday to help as well.  Follow up plan: Return if symptoms worsen or fail to improve.  Counseling provided for all of the vaccine components No orders of the defined types were placed in this encounter.   Arville Care, MD [facility] 12/16/2022, 8:42 AM

## 2023-02-06 ENCOUNTER — Encounter: Payer: Self-pay | Admitting: *Deleted

## 2023-04-01 ENCOUNTER — Ambulatory Visit (INDEPENDENT_AMBULATORY_CARE_PROVIDER_SITE_OTHER): Payer: BC Managed Care – PPO | Admitting: Family Medicine

## 2023-04-01 ENCOUNTER — Encounter: Payer: Self-pay | Admitting: Family Medicine

## 2023-04-01 VITALS — BP 100/60 | HR 90 | Temp 98.5°F | Ht 72.0 in | Wt 153.0 lb

## 2023-04-01 DIAGNOSIS — U071 COVID-19: Secondary | ICD-10-CM | POA: Diagnosis not present

## 2023-04-01 DIAGNOSIS — H60332 Swimmer's ear, left ear: Secondary | ICD-10-CM

## 2023-04-01 DIAGNOSIS — H6122 Impacted cerumen, left ear: Secondary | ICD-10-CM

## 2023-04-01 IMAGING — NM NM HEPATOBILIARY IMAGE, INC GB
2 series · 12 of 12 positions shown · non-contrast
Comparison: CT abdomen and pelvis 02/07/2022, abdomen ultrasound
RIGHT upper quadrant 02/05/2022

CLINICAL DATA: Nondiagnostic ultrasound, RIGHT upper quadrant pain,
epigastric pain, nausea, vomiting, question cholecystitis

EXAM:
NUCLEAR MEDICINE HEPATOBILIARY IMAGING
TECHNIQUE: Sequential images of the abdomen were obtained [DATE] minutes
following intravenous administration of radiopharmaceutical.
RADIOPHARMACEUTICALS:  7.5 mCi Mc-XXm  Choletec IV

[Series 1: hida scan · 3.28mm/px · 6 of 60 frames shown (1 of 2)]
[frame 6/60]
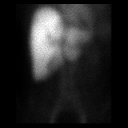
[frame 16/60]
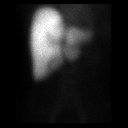
[frame 26/60]
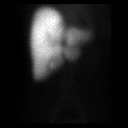
[frame 36/60]
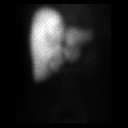
[frame 46/60]
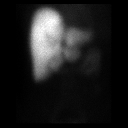
[frame 56/60]
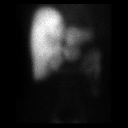

[Series 1: hida scan · 3.28mm/px · 6 of 30 frames shown (2 of 2)]
[frame 3/30]
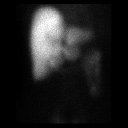
[frame 8/30]
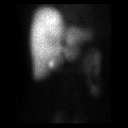
[frame 13/30]
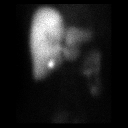
[frame 18/30]
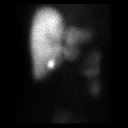
[frame 23/30]
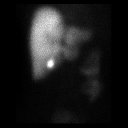
[frame 28/30]
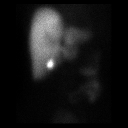

[12 of 12 positions shown; findings below may reference images not displayed]

FINDINGS: Slightly delayed tracer extraction from bloodstream indicating
mildly impaired hepatic function, with blood pool residual noted at
10 minutes. Delayed excretion of tracer into biliary tree. Small
bowel visualized at 37 minutes. At 1 hour gallbladder had not
visualized. Additional imaging for 30 minutes demonstrates
visualization of the gallbladder beginning at 65 minutes. Findings
are consistent with patent cystic duct and common bile duct.
IMPRESSION: Patent cystic duct and common bile duct.

No scintigraphic evidence of acute cholecystitis.

## 2023-04-01 MED ORDER — OFLOXACIN 0.3 % OT SOLN: 5.0000 [drp] | Freq: Every day | OTIC | 0 refills | Status: AC

## 2023-04-01 NOTE — Patient Instructions (Signed)
It appears that you have a viral upper respiratory infection (cold).  Cold symptoms can last up to 2 weeks.    - Get plenty of rest and drink plenty of fluids. - Try to breathe moist air. Use a cold mist humidifier. - Consume warm fluids (soup or tea) to provide relief for a stuffy nose and to loosen phlegm. - For nasal stuffiness, try saline nasal spray or a Neti Pot. Afrin nasal spray can also be used but this product should not be used longer than 3 days or it will cause rebound nasal stuffiness (worsening nasal congestion). - For sore throat pain relief: use chloraseptic spray, suck on throat lozenges, hard candy or popsicles; gargle with warm salt water (1/4 tsp. salt per 8 oz. of water); and eat soft, bland foods. - Eat a well-balanced diet. If you cannot, ensure you are getting enough nutrients by taking a daily multivitamin. - Avoid dairy products, as they can thicken phlegm. - Avoid alcohol, as it impairs your body's immune system.  CONTACT YOUR DOCTOR IF YOU EXPERIENCE ANY OF THE FOLLOWING: - High fever - Ear pain - Sinus-type headache - Unusually severe cold symptoms - Cough that gets worse while other cold symptoms improve - Flare up of any chronic lung problem, such as asthma - Your symptoms persist longer than 2 weeks  For ear pain  Can utilize flonase and zyrtec

## 2023-04-01 NOTE — Progress Notes (Signed)
Acute Office Visit  Subjective:  Patient ID: Louis Leon, male    DOB: 1960-11-18, 62 y.o.   MRN: [id number]  Chief Complaint  Patient presents with   Covid Positive    Congestion fatigue, sore throat X 2 days Bilateral ear pain, pressure x 7 days (feel water in middle ear)   HPI Patient is in today for ear pain that started one week ago while he was at the beach. States that he has been swimming a lot, which makes his symptoms worse. Feels that he cannot get the water out. Tried hydrogen peroxide, alcohol and manual manipulations. Feels that is is worse on left side than right. Two days ago started to have fatigue, sore throat, cough, and congestion. He believed it was due to his ears. Tested positive today for covid. Reports a history of impacted cerumen in bilateral ears.   ROS As per HPI  Objective:  BP 100/60   Pulse 90   Temp 98.5 F (36.9 C)   Ht 6' (1.829 m)   Wt 153 lb (69.4 kg)   SpO2 93%   BMI 20.75 kg/m   Physical Exam Constitutional:      General: He is awake. He is not in acute distress.    Appearance: Normal appearance. He is well-developed and well-groomed. He is not ill-appearing, toxic-appearing or diaphoretic.  HENT:     Right Ear: There is no impacted cerumen. Tympanic membrane is injected, perforated, erythematous and bulging.     Left Ear: Swelling and tenderness present. There is impacted cerumen.     Ears:     Comments: Yellow, purulent drainage in left ear canal    Nose: Congestion and rhinorrhea present. Rhinorrhea is clear.     Right Nostril: No occlusion.     Left Nostril: No occlusion.     Right Sinus: No maxillary sinus tenderness or frontal sinus tenderness.     Left Sinus: No maxillary sinus tenderness or frontal sinus tenderness.     Mouth/Throat:     Mouth: Mucous membranes are moist.     Pharynx: Oropharynx is clear. Posterior oropharyngeal erythema present.     Tonsils: No tonsillar exudate or tonsillar abscesses.  Cardiovascular:      Rate and Rhythm: Normal rate.     Pulses: Normal pulses.          Radial pulses are 2+ on the right side and 2+ on the left side.       Posterior tibial pulses are 2+ on the right side and 2+ on the left side.     Heart sounds: Normal heart sounds. No murmur heard.    No gallop.  Pulmonary:     Effort: Pulmonary effort is normal. No respiratory distress.     Breath sounds: Normal breath sounds. No stridor. No wheezing, rhonchi or rales.  Musculoskeletal:     Cervical back: Full passive range of motion without pain and neck supple.     Right lower leg: No edema.     Left lower leg: No edema.  Lymphadenopathy:     Cervical: Cervical adenopathy present.     Right cervical: No superficial, deep or posterior cervical adenopathy.    Left cervical: Superficial cervical adenopathy present. No deep or posterior cervical adenopathy.  Skin:    General: Skin is warm.     Capillary Refill: Capillary refill takes less than 2 seconds.  Neurological:     General: No focal deficit present.     Mental Status: He  is alert, oriented to person, place, and time and easily aroused. Mental status is at baseline.     GCS: GCS eye subscore is 4. GCS verbal subscore is 5. GCS motor subscore is 6.     Motor: No weakness.  Psychiatric:        Attention and Perception: Attention and perception normal.        Mood and Affect: Mood and affect normal.        Speech: Speech normal.        Behavior: Behavior normal. Behavior is cooperative.        Thought Content: Thought content normal. Thought content does not include homicidal or suicidal ideation. Thought content does not include homicidal or suicidal plan.        Cognition and Memory: Cognition and memory normal.        Judgment: Judgment normal.       04/01/2023    2:50 PM 12/16/2022    8:53 AM 11/27/2022    2:27 PM  Depression screen PHQ 2/9  Decreased Interest 0 0 0  Down, Depressed, Hopeless 0 0 0  PHQ - 2 Score 0 0 0  Altered sleeping 0 0 0  Tired,  decreased energy 1 0 1  Change in appetite 0 0 0  Feeling bad or failure about yourself  0 0 0  Trouble concentrating 0 0 0  Moving slowly or fidgety/restless 0 0 0  Suicidal thoughts  0 0  PHQ-9 Score 1 0 1  Difficult doing work/chores Not difficult at all Not difficult at all Not difficult at all      04/01/2023    2:50 PM 12/16/2022    8:53 AM 11/27/2022    2:28 PM  GAD 7 : Generalized Anxiety Score  Nervous, Anxious, on Edge 0 0 0  Control/stop worrying 0 0 0  Worry too much - different things 0 0 0  Trouble relaxing 0 0 0  Restless 0 0 0  Easily annoyed or irritable 0 0 0  Afraid - awful might happen 0 0 0  Total GAD 7 Score 0 0 0  Anxiety Difficulty Not difficult at all Not difficult at all Not difficult at all   Assessment & Plan:  1. Positive self-administered antigen test for COVID-19 Discussed with patient OTC care. Patient declined antiviral therapy. Given timing of symptoms, antivirals may not have been appropriate at this time.   2. Impacted cerumen, left ear Ear cleanout completed. Discussed with patient continuing to use OTC treatments for ear wax removal and wax care.   3. Acute swimmer's ear of left side Medication as below. Patient to follow up if symptoms continue.  - ofloxacin (FLOXIN) 0.3 % OTIC solution; Place 5 drops into the left ear daily.  Dispense: 5 mL; Refill: 0  The above assessment and management plan was discussed with the patient. The patient verbalized understanding of and has agreed to the management plan using shared-decision making. Patient is aware to call the clinic if they develop any new symptoms or if symptoms fail to improve or worsen. Patient is aware when to return to the clinic for a follow-up visit. Patient educated on when it is appropriate to go to the emergency department.   Return if symptoms worsen or fail to improve.  Neale Burly, DNP-FNP [facility] [address] [place], [place] [address] [phone]

## 2023-04-14 DIAGNOSIS — H40033 Anatomical narrow angle, bilateral: Secondary | ICD-10-CM | POA: Diagnosis not present

## 2023-04-14 DIAGNOSIS — H04123 Dry eye syndrome of bilateral lacrimal glands: Secondary | ICD-10-CM | POA: Diagnosis not present

## 2023-04-23 DIAGNOSIS — J029 Acute pharyngitis, unspecified: Secondary | ICD-10-CM | POA: Diagnosis not present

## 2023-04-23 DIAGNOSIS — R490 Dysphonia: Secondary | ICD-10-CM | POA: Diagnosis not present

## 2023-04-28 DIAGNOSIS — E7849 Other hyperlipidemia: Secondary | ICD-10-CM | POA: Diagnosis not present

## 2023-04-28 DIAGNOSIS — R072 Precordial pain: Secondary | ICD-10-CM | POA: Diagnosis not present

## 2023-04-28 DIAGNOSIS — I471 Supraventricular tachycardia, unspecified: Secondary | ICD-10-CM | POA: Diagnosis not present

## 2023-04-28 DIAGNOSIS — R0602 Shortness of breath: Secondary | ICD-10-CM | POA: Diagnosis not present

## 2023-05-28 ENCOUNTER — Encounter: Payer: BC Managed Care – PPO | Admitting: Family Medicine

## 2023-05-30 ENCOUNTER — Encounter: Payer: BC Managed Care – PPO | Admitting: Family Medicine

## 2023-09-15 DIAGNOSIS — R072 Precordial pain: Secondary | ICD-10-CM | POA: Diagnosis not present

## 2023-09-15 DIAGNOSIS — E7849 Other hyperlipidemia: Secondary | ICD-10-CM | POA: Diagnosis not present

## 2023-09-15 DIAGNOSIS — I471 Supraventricular tachycardia, unspecified: Secondary | ICD-10-CM | POA: Diagnosis not present

## 2023-09-15 DIAGNOSIS — R0602 Shortness of breath: Secondary | ICD-10-CM | POA: Diagnosis not present

## 2023-10-08 ENCOUNTER — Ambulatory Visit (INDEPENDENT_AMBULATORY_CARE_PROVIDER_SITE_OTHER): Payer: BC Managed Care – PPO | Admitting: *Deleted

## 2023-10-08 DIAGNOSIS — Z23 Encounter for immunization: Secondary | ICD-10-CM

## 2023-10-08 NOTE — Progress Notes (Signed)
Patient is in office today for a nurse visit for Immunization. Patient Injection was given in the  Left deltoid. Patient tolerated injection well.

## 2023-10-13 ENCOUNTER — Ambulatory Visit (INDEPENDENT_AMBULATORY_CARE_PROVIDER_SITE_OTHER): Payer: BC Managed Care – PPO

## 2023-10-13 DIAGNOSIS — Z23 Encounter for immunization: Secondary | ICD-10-CM

## 2023-10-13 NOTE — Progress Notes (Signed)
Pt given flu vaccine Tolerated well 

## 2023-10-23 ENCOUNTER — Encounter: Payer: Self-pay | Admitting: Family Medicine

## 2023-10-23 ENCOUNTER — Ambulatory Visit (INDEPENDENT_AMBULATORY_CARE_PROVIDER_SITE_OTHER): Payer: BC Managed Care – PPO | Admitting: Family Medicine

## 2023-10-23 VITALS — BP 101/64 | HR 67 | Temp 97.5°F | Ht 72.0 in | Wt 157.0 lb

## 2023-10-23 DIAGNOSIS — B029 Zoster without complications: Secondary | ICD-10-CM

## 2023-10-23 MED ORDER — VALACYCLOVIR HCL 1 G PO TABS: 1000.0000 mg | ORAL_TABLET | Freq: Three times a day (TID) | ORAL | 0 refills | Status: AC

## 2023-10-23 NOTE — Progress Notes (Signed)
Chief Complaint  Patient presents with   Skin Problem    Spot on his back that appeared last week. Believes its herpes simplex. Pt has been around an infant so he is concerned. It started as 2 and now there are 5. Painful. No drainage.     HPI  Patient presents today for painful outbreak of rash on his back and right side, following the nerve.   PMH: Smoking status noted ROS: Per HPI  Objective: BP 101/64   Pulse 67   Temp (!) 97.5 F (36.4 C)   Ht 6' (1.829 m)   Wt 157 lb (71.2 kg)   SpO2 94%   BMI 21.29 kg/m  Gen: NAD, alert, cooperative with exam HEENT: NCAT, EOMI, PERRL CV: RRR, good S1/S2, no murmur Resp: CTABL, no wheezes, non-labored Skin multiple groupings of vesicular erythema at the right back follow T3-4 dermatome around to the anterior axillary line Ext: No edema, warm Neuro: Alert and oriented, No gross deficits  Assessment and plan:  1. Herpes zoster without complication     Meds ordered this encounter  Medications   valACYclovir (VALTREX) 1000 MG tablet    Sig: Take 1 tablet (1,000 mg total) by mouth 3 (three) times daily.    Dispense:  21 tablet    Refill:  0    No orders of the defined types were placed in this encounter.   Follow up as needed.  Mechele Claude, MD

## 2023-12-04 ENCOUNTER — Telehealth: Payer: Self-pay

## 2023-12-04 NOTE — Telephone Encounter (Signed)
 Copied from CRM [id number]. Topic: Clinical - Request for Lab/Test Order >> Dec 04, 2023 10:13 AM Marland Kitchen D wrote: Patient is requesting a blood draw to be done and sent to [facility]

## 2023-12-04 NOTE — Telephone Encounter (Signed)
 Pt needed lab appt. Appt made. Pt aware

## 2023-12-09 ENCOUNTER — Other Ambulatory Visit

## 2023-12-15 ENCOUNTER — Telehealth: Payer: Self-pay

## 2023-12-15 NOTE — Telephone Encounter (Signed)
 Copied from CRM [id number]. Topic: Clinical - Request for Lab/Test Order >> Dec 15, 2023 11:37 AM Marland Kitchen D wrote: Patient is checking to see if the office has received the correct form for him to get his lab work done. From Dr. Algie Coffer office

## 2023-12-15 NOTE — Telephone Encounter (Signed)
 Checked with lab and informed pt we do not have lab orders. Pt verbalized understanding

## 2024-01-19 ENCOUNTER — Other Ambulatory Visit

## 2024-01-19 DIAGNOSIS — E876 Hypokalemia: Secondary | ICD-10-CM | POA: Diagnosis not present

## 2024-01-19 DIAGNOSIS — Z79899 Other long term (current) drug therapy: Secondary | ICD-10-CM | POA: Diagnosis not present

## 2024-01-19 DIAGNOSIS — E1165 Type 2 diabetes mellitus with hyperglycemia: Secondary | ICD-10-CM | POA: Diagnosis not present

## 2024-01-19 DIAGNOSIS — E7849 Other hyperlipidemia: Secondary | ICD-10-CM | POA: Diagnosis not present

## 2024-01-19 DIAGNOSIS — D649 Anemia, unspecified: Secondary | ICD-10-CM | POA: Diagnosis not present

## 2024-01-19 DIAGNOSIS — N401 Enlarged prostate with lower urinary tract symptoms: Secondary | ICD-10-CM | POA: Diagnosis not present
